# Patient Record
Sex: Male | Born: 1985 | Race: White | Hispanic: No | Marital: Single | Smoking: Never smoker
Health system: Southern US, Community
[De-identification: ages and names within clinical notes are randomized; demographics above are authoritative.]

## PROBLEM LIST (undated history)

## (undated) DIAGNOSIS — J302 Other seasonal allergic rhinitis: Secondary | ICD-10-CM

## (undated) DIAGNOSIS — F32A Depression, unspecified: Secondary | ICD-10-CM

## (undated) DIAGNOSIS — R030 Elevated blood-pressure reading, without diagnosis of hypertension: Secondary | ICD-10-CM

## (undated) DIAGNOSIS — IMO0001 Reserved for inherently not codable concepts without codable children: Secondary | ICD-10-CM

## (undated) DIAGNOSIS — F419 Anxiety disorder, unspecified: Secondary | ICD-10-CM

## (undated) DIAGNOSIS — G43909 Migraine, unspecified, not intractable, without status migrainosus: Secondary | ICD-10-CM

## (undated) DIAGNOSIS — F329 Major depressive disorder, single episode, unspecified: Secondary | ICD-10-CM

## (undated) DIAGNOSIS — R5383 Other fatigue: Secondary | ICD-10-CM

## (undated) DIAGNOSIS — K219 Gastro-esophageal reflux disease without esophagitis: Secondary | ICD-10-CM

## (undated) HISTORY — DX: Other seasonal allergic rhinitis: J30.2

## (undated) HISTORY — PX: OTHER SURGICAL HISTORY: SHX169

## (undated) HISTORY — DX: Major depressive disorder, single episode, unspecified: F32.9

## (undated) HISTORY — DX: Reserved for inherently not codable concepts without codable children: IMO0001

## (undated) HISTORY — PX: WISDOM TOOTH EXTRACTION: SHX21

## (undated) HISTORY — PX: INNER EAR SURGERY: SHX679

## (undated) HISTORY — DX: Depression, unspecified: F32.A

## (undated) HISTORY — DX: Migraine, unspecified, not intractable, without status migrainosus: G43.909

## (undated) HISTORY — DX: Gastro-esophageal reflux disease without esophagitis: K21.9

## (undated) HISTORY — DX: Elevated blood-pressure reading, without diagnosis of hypertension: R03.0

## (undated) HISTORY — DX: Other fatigue: R53.83

---
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
## 2010-07-26 ENCOUNTER — Observation Stay (HOSPITAL_COMMUNITY)
Admission: AD | Admit: 2010-07-26 | Discharge: 2010-07-28 | DRG: 581 | Disposition: A | Discharge status: Home / Self Care | Payer: 59 | Source: Ambulatory Visit | Attending: Orthopedic Surgery | Admitting: Orthopedic Surgery

## 2010-07-26 DIAGNOSIS — L02619 Cutaneous abscess of unspecified foot: Principal | ICD-10-CM | POA: Insufficient documentation

## 2010-07-26 DIAGNOSIS — S91309A Unspecified open wound, unspecified foot, initial encounter: Secondary | ICD-10-CM | POA: Insufficient documentation

## 2010-07-26 DIAGNOSIS — Z1889 Other specified retained foreign body fragments: Secondary | ICD-10-CM | POA: Insufficient documentation

## 2010-07-26 DIAGNOSIS — X58XXXA Exposure to other specified factors, initial encounter: Secondary | ICD-10-CM | POA: Insufficient documentation

## 2010-07-26 DIAGNOSIS — L03119 Cellulitis of unspecified part of limb: Secondary | ICD-10-CM | POA: Insufficient documentation

## 2010-07-26 LAB — BASIC METABOLIC PANEL
Chloride: 100 mEq/L (ref 96–112)
GFR calc Af Amer: >60 mL/min (ref >60)
GFR calc non Af Amer: >60 mL/min (ref >60)
Glucose, Bld: 85 mg/dL (ref 70–99)
Potassium: 3.6 mEq/L (ref 3.5–5.1)
Sodium: 137 mEq/L (ref 135–145)

## 2010-07-26 LAB — CBC
Hemoglobin: 15.3 g/dL (ref 13.0–17.0)
MCHC: 35.3 g/dL (ref 30.0–36.0)
WBC: 11.7 10*3/uL — ABNORMAL HIGH (ref 4.0–10.5)

## 2010-07-26 LAB — DIFFERENTIAL
Basophils Absolute: 0 10*3/uL (ref 0.0–0.1)
Basophils Relative: 0 % (ref 0–1)
Monocytes Absolute: 1.2 10*3/uL — ABNORMAL HIGH (ref 0.1–1.0)
Neutro Abs: 8.3 10*3/uL — ABNORMAL HIGH (ref 1.7–7.7)
Neutrophils Relative %: 71 % (ref 43–77)

## 2010-07-26 LAB — SURGICAL PCR SCREEN: Staphylococcus aureus: INVALID — AB

## 2010-07-27 LAB — BASIC METABOLIC PANEL
Chloride: 101 mEq/L (ref 96–112)
GFR calc Af Amer: >60 mL/min (ref >60)
Potassium: 4.5 mEq/L (ref 3.5–5.1)

## 2010-07-27 LAB — CBC
HCT: 45.4 % (ref 39.0–52.0)
Platelets: 221 10*3/uL (ref 150–400)
RDW: 12.6 % (ref 11.5–15.5)
WBC: 9.4 10*3/uL (ref 4.0–10.5)

## 2010-07-30 LAB — CULTURE, ROUTINE-ABSCESS
Culture: NO GROWTH
Gram Stain: NONE SEEN

## 2010-08-30 NOTE — Op Note (Signed)
NAME:  Connor Powell, Connor Powell NO.:  [id number]  MEDICAL RECORD NO.:  [id number]  LOCATION:  DAYL                         FACILITY:  [facility]  PHYSICIAN:  Dionne Ano. Lindey Renzulli, M.D.DATE OF BIRTH:  06/09/1985  DATE OF PROCEDURE: DATE OF DISCHARGE:                              OPERATIVE REPORT   PREOPERATIVE DIAGNOSIS:  Infected left foot puncture wound with retained foreign body and likely abscess.  POSTOPERATIVE DIAGNOSIS:  Abscess left foot with foreign body.  PROCEDURES PERFORMED: 1. Irrigation and debridement left foot deep abscess. 2. Foreign body removal left foot. 3. Stress radiography.  SURGEON:  Dionne Ano. Amanda Pea, M.D.  ASSISTANT:  None.  COMPLICATIONS:  None.  ANESTHESIA:  General.  TOURNIQUET TIME:  Less than 30 minutes.  COMPLICATIONS:  None.  ESTIMATED BLOOD LOSS:  Minimal.  INDICATIONS FOR PROCEDURE:  This patient is a 25 year old male, who presented to my office Monday at 3:00 p.m.  I diagnosed his above- mentioned diagnosis and recommend swift operative treatment.  He understands and accepts risks and benefits of surgery and desires to proceed with the above-mentioned operative intervention.  In short, he was injured Friday, was seen at Urgent Care on Saturday and it was recommended that he see [place] Monday.  I did not have any contact with the Emergency Room physicians/Urgent Care.  I also did not have any contact with the patient prior to today when I swiftly recommended operative intervention.  He and his family understand the risks and benefits and desired to proceed.  They understand the risk of chronic osteo and other issues.  OPERATIVE PROCEDURE:  Patient was seen by myself and Anesthesia and underwent smooth induction of general anesthesia, we did allow appropriate time for n.p.o. status.  I have discussed this with Dr. Okey Dupre.  Following carefully evaluating the foot and the abscess as well as erythema, he was given a general anesthetic.   Once this was complete, we then performed prep and drape, scrub and paint about the foot.  This was the left foot in question.  Once this was complete, I then localized the retained foreign body/half of a needle and I then made an incision over the entrance point.  I immediately encountered purulence.  This was cultured for aerobic and anaerobic cultures.  Following culture of aerobic and anaerobic nature, I then dissected down deeply, dissected until I could remove the foreign body.  There was a large amount of de- epithelialized skin, tissue and fatty necrosis.  There was a darkened exudative process it appeared and the soft tissues where breakdown of the outer portion of the needle had occurred.  There was some type of discoloration here noted.  I very carefully removed the needle and this was an extraction of foreign body followed by I and D of skin and subcutaneous tissue, muscle and tendon tissue.  The bone was not involved.  I then copiously placed irrigant in the wound followed by placement of iodoform gauze in the wound.  Thus, the patient underwent I and D, foreign body removal and stress radiography confirmed complete removal.  I was pleased with the findings.  Following this, I wrapped him sterilely.  Iodoform was placed followed by  gauze, Kerlix and Ace wrap.  He will be admitted for IV Cipro.  I will await cultures and monitor his progress closely, elevation, minimal weightbearing will be the rule and he will notify me should any problems occur.  We will monitor his condition very closely.  All sponge, needle and instrument counts were reported as correct.     Dionne Ano. Amanda Pea, M.D.     [facility]  D:  07/26/2010  T:  07/26/2010  Job:  [id number]  cc:   Dionne Ano. Amanda Pea, M.D. Fax: [phone]  Electronically Signed by Dominica Severin M.D. on 08/30/2010 06:30:27 AM

## 2010-09-02 ENCOUNTER — Emergency Department (HOSPITAL_COMMUNITY)
Admission: EM | Admit: 2010-09-02 | Discharge: 2010-09-02 | Disposition: A | Discharge status: Home / Self Care | Payer: 59 | Attending: Emergency Medicine | Admitting: Emergency Medicine

## 2010-09-02 ENCOUNTER — Ambulatory Visit (INDEPENDENT_AMBULATORY_CARE_PROVIDER_SITE_OTHER): Payer: 59 | Admitting: Otolaryngology

## 2010-09-02 ENCOUNTER — Encounter: Payer: Self-pay | Admitting: *Deleted

## 2010-09-02 DIAGNOSIS — H9209 Otalgia, unspecified ear: Secondary | ICD-10-CM

## 2010-09-02 DIAGNOSIS — H66019 Acute suppurative otitis media with spontaneous rupture of ear drum, unspecified ear: Secondary | ICD-10-CM

## 2010-09-02 DIAGNOSIS — H9210 Otorrhea, unspecified ear: Secondary | ICD-10-CM | POA: Insufficient documentation

## 2010-09-02 DIAGNOSIS — H6980 Other specified disorders of Eustachian tube, unspecified ear: Secondary | ICD-10-CM

## 2010-09-02 MED ORDER — AMOXICILLIN-POT CLAVULANATE 875-125 MG PO TABS: 1.0000 | ORAL_TABLET | Freq: Two times a day (BID) | ORAL | Status: AC

## 2010-09-02 MED ORDER — NEOMYCIN-POLYMYXIN-HC 3.5-10000-1 OT SUSP: 4.0000 [drp] | Freq: Three times a day (TID) | OTIC | Status: AC

## 2010-09-02 MED ORDER — AMOXICILLIN 250 MG PO CAPS
500.0000 mg | ORAL_CAPSULE | Freq: Once | ORAL | Status: AC
  Administered 2010-09-02: 500 mg via ORAL

## 2010-09-02 NOTE — ED Notes (Signed)
Report to kristy rn

## 2010-09-02 NOTE — ED Notes (Signed)
Left ear bleeding since around 11pm last hs.

## 2010-09-02 NOTE — ED Notes (Signed)
Pt c/o bleeding from left ear that started last night around 11pm; pt denies any ear pain

## 2010-09-02 NOTE — ED Provider Notes (Signed)
History     CSN: [id number] Arrival date & time: 09/02/2010  6:00 AM  Chief Complaint  Patient presents with  . Ear Drainage    bleeding from left ear   HPI Comments: Seen 0620  Patient is a 25 y.o. male presenting with ear drainage.  Ear Drainage This is a new problem. Episode onset: woke up with blood on the pillow and blood from the left ear. Episode frequency: Has had 5 previous episodes of similar findings associated with  tympatic  rupture. The problem has not changed since onset.Pertinent negatives include no chest pain, no abdominal pain, no headaches and no shortness of breath. Associated symptoms comments: No hearing loss. The symptoms are aggravated by nothing. The symptoms are relieved by nothing. He has tried nothing for the symptoms.    History reviewed. No pertinent past medical history.  Past Surgical History  Procedure Date  . Wisdom tooth extraction     History reviewed. No pertinent family history.  History  Substance Use Topics  . Smoking status: Never Smoker   . Smokeless tobacco: Not on file  . Alcohol Use: Yes     rarely      Review of Systems  HENT: Positive for ear discharge.   Respiratory: Negative for shortness of breath.   Cardiovascular: Negative for chest pain.  Gastrointestinal: Negative for abdominal pain.  Neurological: Negative for headaches.  All other systems reviewed and are negative.    Physical Exam  BP 137/78  Pulse 83  Temp(Src) 98.5 F (36.9 C) (Oral)  Resp 20  Ht 5\' 7"  (1.702 m)  Wt 235 lb (106.595 kg)  BMI 36.81 kg/m2  SpO2 99%  Physical Exam  Nursing note and vitals reviewed. Constitutional: He is oriented to person, place, and time. He appears well-developed and well-nourished.  HENT:  Head: Normocephalic and atraumatic.  Right Ear: External ear normal.       Left tympanic membrane with rupture. Mild exudate.  Eyes: EOM are normal. Pupils are equal, round, and reactive to light.  Neck: Normal range of  motion. Neck supple.  Cardiovascular: Normal rate, normal heart sounds and intact distal pulses.   Pulmonary/Chest: Effort normal and breath sounds normal.  Abdominal: Soft. Bowel sounds are normal.  Musculoskeletal: Normal range of motion.  Neurological: He is alert and oriented to person, place, and time.  Skin: Skin is warm and dry.    ED Course  Procedures  MDM       Nicoletta Dress. Colon Branch, MD 09/02/10 [phone]

## 2010-09-22 ENCOUNTER — Other Ambulatory Visit (INDEPENDENT_AMBULATORY_CARE_PROVIDER_SITE_OTHER): Payer: Self-pay | Admitting: Otolaryngology

## 2010-09-22 DIAGNOSIS — H7190 Unspecified cholesteatoma, unspecified ear: Secondary | ICD-10-CM

## 2010-09-29 ENCOUNTER — Other Ambulatory Visit: Payer: 59

## 2010-09-29 ENCOUNTER — Ambulatory Visit
Admission: RE | Admit: 2010-09-29 | Discharge: 2010-09-29 | Disposition: A | Discharge status: Home / Self Care | Payer: 59 | Source: Ambulatory Visit | Attending: Otolaryngology | Admitting: Otolaryngology

## 2010-09-29 DIAGNOSIS — H7190 Unspecified cholesteatoma, unspecified ear: Secondary | ICD-10-CM

## 2011-05-06 ENCOUNTER — Encounter (HOSPITAL_COMMUNITY): Payer: Self-pay

## 2011-05-06 ENCOUNTER — Emergency Department (HOSPITAL_COMMUNITY)
Admission: EM | Admit: 2011-05-06 | Discharge: 2011-05-07 | Disposition: A | Discharge status: Home / Self Care | Payer: 59 | Attending: Emergency Medicine | Admitting: Emergency Medicine

## 2011-05-06 ENCOUNTER — Emergency Department (HOSPITAL_COMMUNITY): Payer: 59

## 2011-05-06 DIAGNOSIS — R55 Syncope and collapse: Secondary | ICD-10-CM | POA: Insufficient documentation

## 2011-05-06 DIAGNOSIS — R51 Headache: Secondary | ICD-10-CM | POA: Insufficient documentation

## 2011-05-06 HISTORY — DX: Anxiety disorder, unspecified: F41.9

## 2011-05-06 LAB — DIFFERENTIAL
Eosinophils Relative: 2 % (ref 0–5)
Lymphocytes Relative: 26 % (ref 12–46)
Lymphs Abs: 2.1 10*3/uL (ref 0.7–4.0)
Monocytes Absolute: 0.7 10*3/uL (ref 0.1–1.0)

## 2011-05-06 LAB — CBC
HCT: 40.5 % (ref 39.0–52.0)
MCV: 82.5 fL (ref 78.0–100.0)
RBC: 4.91 MIL/uL (ref 4.22–5.81)
WBC: 8.2 10*3/uL (ref 4.0–10.5)

## 2011-05-06 LAB — COMPREHENSIVE METABOLIC PANEL
ALT: 17 U/L (ref 0–53)
CO2: 28 mEq/L (ref 19–32)
Calcium: 9.3 mg/dL (ref 8.4–10.5)
Creatinine, Ser: 1.03 mg/dL (ref 0.50–1.35)
GFR calc Af Amer: >90 mL/min (ref >90)
GFR calc non Af Amer: >90 mL/min (ref >90)
Glucose, Bld: 92 mg/dL (ref 70–99)
Sodium: 138 mEq/L (ref 135–145)
Total Bilirubin: 0.3 mg/dL (ref 0.3–1.2)

## 2011-05-06 NOTE — Discharge Instructions (Signed)
You need to get further testing done that can't be done in the ED. You should get an EEG to see if you have abnormal brain waves that could indicate your could be having seizures and you should be evaluated by a neurologist You can call Dr Ronal Fear office to get an appointment to see him . You should also get a cardiology evaluation to make sure your passing out episodes aren't related to your heart. Call Dr Marvel Plan office to get an appointment for that. NO DRIVING UNTIL YOU ARE CLEARED TO DRIVE BY DR [facility]!!!  Driving and Equipment Restrictions Some medical problems make it dangerous to drive, ride a bike, or use machines. Some of these problems are:  A hard blow to the head (concussion).   Passing out (fainting).   Twitching and shaking (seizures).   Low blood sugar.   Taking medicine to help you relax (sedatives).   Taking pain medicines.   Wearing an eye patch.   Wearing splints. This can make it hard to use parts of your body that you need to drive safely.  HOME CARE   Do not drive until your doctor says it is okay.   Do not use machines until your doctor says it is okay.  You may need a form signed by your doctor (medical release) before you can drive again. You may also need this form before you do other tasks where you need to be fully alert. MAKE SURE YOU:  Understand these instructions.   Will watch your condition.   Will get help right away if you are not doing well or get worse.  Document Released: 02/04/2004 Document Revised: 12/16/2010 Document Reviewed: 05/06/2009 [facility] Patient Information 2012 [place], [place].Syncope Syncope (fainting) is a sudden, short loss of consciousness. People normally fall to the ground when they faint. Recovery is often fast. HOME CARE  Do not drive or use machines. Wait until your doctor says it is safe to do so.   If you have diabetes, check your blood sugar. If it is low (below 70), you need to drink or eat something  sweet. If over 300, call your doctor.   If you have a blood pressure machine at home, take your blood pressure. If the top number is below 100 or above 170, call your doctor.   Lie down until you feel normal.   Drink extra fluids (water, juice, soup).  GET HELP RIGHT AWAY IF:   You pass out (faint) when sitting or lying down. Do not drive. Call your local emergency services (911 in [place] if no one is there to help you.   There is chest pain.   You feel sick to your stomach (nauseous) or keep throwing up (vomiting).   You have very bad belly (abdominal) pain.   You feel your heartbeat is fast or not normal.   You lose feeling in some part of the body.   You cannot move your arms or legs.   You cannot talk well and get confused.   You feel weak or cannot see well.   You get sweaty and feel lightheaded.  MAKE SURE YOU:   Understand these instructions.   Will watch your condition.   Will get help right away if you are not doing well or get worse.  Document Released: 06/15/2007 Document Revised: 12/16/2010 Document Reviewed: 06/15/2007 [facility] Patient Information 2012 [place], [place].

## 2011-05-06 NOTE — ED Provider Notes (Signed)
This chart was scribed for Ward Givens, MD by Williemae Natter. The patient was seen in room APAH7/APAH7 at 9:22 PM.  History     CSN: [id number]  Arrival date & time 05/06/11  1907   First MD Initiated Contact with Patient 05/06/11 2049      Chief Complaint  Patient presents with  . Loss of Consciousness    (Consider location/radiation/quality/duration/timing/severity/associated sxs/prior treatment) Patient is a 26 y.o. male presenting with syncope. The history is provided by the patient and a parent.  Loss of Consciousness This is a recurrent problem. The current episode started 3 to 5 hours ago. The problem occurs rarely. The problem has not changed since onset.Associated symptoms include headaches. Pertinent negatives include no chest pain and no shortness of breath. The symptoms are aggravated by nothing. The symptoms are relieved by nothing. He has tried nothing for the symptoms.   Connor Powell is a 26 y.o. male who presents to the Emergency Department complaining of acute onset loss of consciousness. Pt blacked out while walking through the kitchen at 4:30 PM today. Pt did not feel light-headed or have any other warnings before losing consciousness other than he had a mild headache on the left side of head. LOC at 4:30 PM and  woke up at 5:10 PM today. PT felt tired after coming to. No chest pain, no sob, no nausea, no vomiting, no light-headedness, no palpitations, no dizziness, and no excessive sweating. Similar episode of syncope yesterday out in the yard where he was found and woken up by Dad. Pt has blackouts like this a few times a year for the past 6 or 7 years but parents were unaware. No one has ever witnessed any of the events. He denies incontinance. No family hx of seizures. No change in habits or precipitating events.   Pt relates he was having trouble sleeping and took ativan 2 mg at night for sleep.    PCP-Dr. Marva Panda at [facility]  Past  Medical History  Diagnosis Date  . Depressed   . Anxiety     Past Surgical History  Procedure Date  . Wisdom tooth extraction   . Inner ear surgery   cholesteoma surgery on left in October.  No family history on file. MGF died aged 74 from MI MGM CAD FOP AODM No seizures AODM  History  Substance Use Topics  . Smoking status: Never Smoker   . Smokeless tobacco: Not on file  . Alcohol Use: Yes     rarely  lives with parents unemployed   Review of Systems  Constitutional: Negative for fever.  Respiratory: Negative for shortness of breath.   Cardiovascular: Positive for syncope. Negative for chest pain.  Neurological: Positive for syncope and headaches. Negative for seizures, speech difficulty, light-headedness and numbness.  All other systems reviewed and are negative.    Allergies  Aspirin  Home Medications   Current Outpatient Rx  Name Route Sig Dispense Refill  . CITALOPRAM HYDROBROMIDE 40 MG PO TABS Oral Take 40 mg by mouth daily.    Marland Kitchen LORAZEPAM 2 MG PO TABS Oral Take 2 mg by mouth daily as needed. For anxiety      BP 123/74  Pulse 79  Temp(Src) 98.3 F (36.8 C) (Oral)  Resp 18  Ht 5\' 7"  (1.702 m)  Wt 235 lb (106.595 kg)  BMI 36.81 kg/m2  SpO2 100%  Vital signs normal   Orthostatic VS normal   Physical Exam  Nursing note  and vitals reviewed. Constitutional: He is oriented to person, place, and time. He appears well-developed and well-nourished.  Non-toxic appearance. He does not appear ill. No distress.  HENT:  Head: Normocephalic and atraumatic.  Right Ear: External ear normal.  Left Ear: External ear normal.  Nose: Nose normal. No mucosal edema or rhinorrhea.  Mouth/Throat: Oropharynx is clear and moist and mucous membranes are normal. No dental abscesses or uvula swelling.       Mildy tender in the right parietal area where he states he hit his head today when he fell.   Eyes: Conjunctivae and EOM are normal. Pupils are equal, round, and  reactive to light.  Neck: Normal range of motion and full passive range of motion without pain. Neck supple.  Cardiovascular: Normal rate, regular rhythm and normal heart sounds.  Exam reveals no gallop and no friction rub.   No murmur heard. Pulmonary/Chest: Effort normal and breath sounds normal. No respiratory distress. He has no wheezes. He has no rhonchi. He has no rales. He exhibits no tenderness and no crepitus.  Abdominal: Soft. Normal appearance and bowel sounds are normal. He exhibits no distension. There is no tenderness. There is no rebound and no guarding.  Musculoskeletal: Normal range of motion. He exhibits no edema and no tenderness.       Moves all extremities well.   Neurological: He is alert and oriented to person, place, and time. He has normal strength. No cranial nerve deficit or sensory deficit. He exhibits normal muscle tone. Coordination normal.  Skin: Skin is warm, dry and intact. No rash noted. No erythema. No pallor.  Psychiatric: He has a normal mood and affect. His speech is normal and behavior is normal. His mood appears not anxious.    ED Course  Procedures (including critical care time) DIAGNOSTIC STUDIES: Oxygen Saturation is 100% on room air, normal by my interpretation.    COORDINATION OF CARE:  Results for orders placed during the hospital encounter of 05/06/11  CBC      Component Value Range   WBC 8.2  4.0 - 10.5 (K/uL)   RBC 4.91  4.22 - 5.81 (MIL/uL)   Hemoglobin 14.2  13.0 - 17.0 (g/dL)   HCT 16.1  09.6 - 04.5 (%)   MCV 82.5  78.0 - 100.0 (fL)   MCH 28.9  26.0 - 34.0 (pg)   MCHC 35.1  30.0 - 36.0 (g/dL)   RDW 40.9  81.1 - 91.4 (%)   Platelets 196  150 - 400 (K/uL)  DIFFERENTIAL      Component Value Range   Neutrophils Relative 63  43 - 77 (%)   Neutro Abs 5.2  1.7 - 7.7 (K/uL)   Lymphocytes Relative 26  12 - 46 (%)   Lymphs Abs 2.1  0.7 - 4.0 (K/uL)   Monocytes Relative 9  3 - 12 (%)   Monocytes Absolute 0.7  0.1 - 1.0 (K/uL)    Eosinophils Relative 2  0 - 5 (%)   Eosinophils Absolute 0.2  0.0 - 0.7 (K/uL)   Basophils Relative 0  0 - 1 (%)   Basophils Absolute 0.0  0.0 - 0.1 (K/uL)  COMPREHENSIVE METABOLIC PANEL      Component Value Range   Sodium 138  135 - 145 (mEq/L)   Potassium 3.5  3.5 - 5.1 (mEq/L)   Chloride 101  96 - 112 (mEq/L)   CO2 28  19 - 32 (mEq/L)   Glucose, Bld 92  70 - 99 (mg/dL)  BUN 10  6 - 23 (mg/dL)   Creatinine, Ser 1.61  0.50 - 1.35 (mg/dL)   Calcium 9.3  8.4 - 09.6 (mg/dL)   Total Protein 6.7  6.0 - 8.3 (g/dL)   Albumin 4.1  3.5 - 5.2 (g/dL)   AST 17  0 - 37 (U/L)   ALT 17  0 - 53 (U/L)   Alkaline Phosphatase 56  39 - 117 (U/L)   Total Bilirubin 0.3  0.3 - 1.2 (mg/dL)   GFR calc non Af Amer >90  >90 (mL/min)   GFR calc Af Amer >90  >90 (mL/min)  MAGNESIUM      Component Value Range   Magnesium 2.1  1.5 - 2.5 (mg/dL)  TROPONIN I      Component Value Range   Troponin I <0.30  <0.30 (ng/mL)   Laboratory interpretation all normal  Ct Head Wo Contrast  05/06/2011  *RADIOLOGY REPORT*  Clinical Data: Syncopal episode  CT HEAD WITHOUT CONTRAST  Technique:  Contiguous axial images were obtained from the base of the skull through the vertex without contrast.  Comparison: 09/29/2010  Findings: There is no evidence for acute hemorrhage, hydrocephalus, mass lesion, or abnormal extra-axial fluid collection.  No definite CT evidence for acute infarction.  The visualized paranasal sinuses are predominately clear. The mastoid air cells are underpneumatized and partially opacified, similar to comparison. Increased fluid/debris within the left external auditory canal.  IMPRESSION: No acute intracranial abnormality.  Underpneumatized and partially opacified mastoid air cells. 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  Interval increased fluid/debris within the left external auditory canal.  Correlate with direct inspection.  Original Report Authenticated By: Waneta Martins, M.D.     Date:  05/07/2011  Rate: 66  Rhythm: normal sinus rhythm and sinus arrhythmia  QRS Axis: normal  Intervals: normal  ST/T Wave abnormalities: normal  Conduction Disutrbances:none  Narrative Interpretation:   Old EKG Reviewed: none available   1. Syncopal episodes     Plan discharge  Devoria Albe, MD, FACEP   MDM   I personally performed the services described in this documentation, which was scribed in my presence. The recorded information has been reviewed and considered. Devoria Albe, MD, FACEP         Ward Givens, MD 05/07/11 0001

## 2011-05-06 NOTE — ED Notes (Signed)
Pt presents after "passing out" today and hitting head on floor. Per Mother pt had same occur yesterday and pt was found unconscious laying face down in yard. Pt states "this happened every once in a while but I thought it was from low blood pressure or low blood sugar". Pt reports noting a slight headache before syncopal episode.

## 2011-05-07 NOTE — ED Notes (Signed)
Discharge instructions reviewed with pt, questions answered. Pt verbalized understanding.  

## 2011-06-15 ENCOUNTER — Encounter: Payer: Self-pay | Admitting: Cardiology

## 2011-06-16 ENCOUNTER — Encounter: Payer: Self-pay | Admitting: Cardiology

## 2011-06-16 ENCOUNTER — Ambulatory Visit (INDEPENDENT_AMBULATORY_CARE_PROVIDER_SITE_OTHER): Payer: 59 | Admitting: Cardiology

## 2011-06-16 VITALS — BP 148/94 | HR 86 | Resp 18 | Ht 67.0 in | Wt 244.0 lb

## 2011-06-16 DIAGNOSIS — R55 Syncope and collapse: Secondary | ICD-10-CM

## 2011-06-16 DIAGNOSIS — R030 Elevated blood-pressure reading, without diagnosis of hypertension: Secondary | ICD-10-CM

## 2011-06-16 NOTE — Progress Notes (Signed)
   Clinical Summary Mr. Incorvaia is a 26 y.o.male referred for evaluation of syncope after ER visit back in April. He has intermittent medical care at [facility].  He reports a history of recurrent syncope, noted over at least the last five years. These episodes occur without warning, usually when he is at home and alone, have no clear precipitant or aura. He reports no palpitations or chest pain these events. Also not related to loud sounds or exercise. Typically these occur 1-2 times a year. He has had no events since April. States mental stress may be involved.  Copy of ECG from April reviewed showing sinus rhythm with no clear evidence of preexcitation, PR interval 140 ms, no Brugada pattern, no QT prolongation.  He states that he had a neurological workup with Dr. Gerilyn Pilgrim without diagnosis.  Episodes have preceded use of current medications.  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   Allergies  Allergen Reactions  . Aspirin Hives and Shortness Of Breath    Patient took Alka-Seltzer in the past-resulted in Hives, shortness of breath    Current Outpatient Prescriptions  Medication Sig Dispense Refill  . citalopram (CELEXA) 40 MG tablet Take 40 mg by mouth daily.      Marland Kitchen LORazepam (ATIVAN) 2 MG tablet Take 2 mg by mouth daily as needed. For anxiety        Past Medical History  Diagnosis Date  . Depression   . Anxiety   . Elevated blood pressure     Has not required treatment    Past Surgical History  Procedure Date  . Wisdom tooth extraction   . Inner ear surgery   . Incision and drainage of left foot     Family History  Problem Relation Age of Onset  . Diabetes Father   . Heart failure Maternal Grandmother     Social History Mr. Sollenberger reports that he has never smoked. He has never used smokeless tobacco. Mr. Duval reports that he drinks alcohol.  Review of Systems No palpitations. Stable appetite. No regular exercise. No  angina. NYHA class II dyspnea. No bleeding problems. Increased stress last six months. Otherwise negative.  Physical Examination Filed Vitals:   06/16/11 1418  BP: 148/94  Pulse: 86  Resp: 18   Overweight male in NAD. HEENT: Conjunctiva and lids normal, oropharynx clear with moist mucosa. Neck: Supple, no elevated JVP or carotid bruits, no thyromegaly. Lungs: Clear to auscultation, nonlabored breathing at rest. Cardiac: Regular rate and rhythm, no S3 or significant systolic murmur, no pericardial rub. Abdomen: Soft, nontender, bowel sounds present, no guarding or rebound. Extremities: No pitting edema, distal pulses 2+. Skin: Warm and dry. Some acne. Musculoskeletal: No kyphosis. Neuropsychiatric: Alert and oriented x3, affect grossly appropriate.   ECG SInus rhythm with normal QT, no pre-excitation, no Brugada pattern.   Problem List and Plan   Syncope Outlined above - etiology not clear. Reportedly neurology workup with Dr. Gerilyn Pilgrim was unrevealing. ECG is normal as noted. No exertional component. Not clearly related to medications - the events have occurred before their use. For now plan is to obtain echocardiogram and a 30 day event monitor. Office followup arranged.  Elevated blood pressure Noted at office visit, not on medical therapy. Keep an eye on this at general medical followup.     Jonelle Sidle, M.D., F.A.C.C.

## 2011-06-16 NOTE — Assessment & Plan Note (Signed)
Outlined above - etiology not clear. Reportedly neurology workup with Dr. Gerilyn Pilgrim was unrevealing. ECG is normal as noted. No exertional component. Not clearly related to medications - the events have occurred before their use. For now plan is to obtain echocardiogram and a 30 day event monitor. Office followup arranged.

## 2011-06-16 NOTE — Assessment & Plan Note (Signed)
Noted at office visit, not on medical therapy. Keep an eye on this at general medical followup.

## 2011-06-16 NOTE — Patient Instructions (Signed)
**Note De-Identified Connor Powell Obfuscation** Your physician has recommended that you wear an event monitor. Event monitors are medical devices that record the heart's electrical activity. Doctors most often [place] these monitors to diagnose arrhythmias. Arrhythmias are problems with the speed or rhythm of the heartbeat. The monitor is a small, portable device. You can wear one while you do your normal daily activities. This is usually used to diagnose what is causing palpitations/syncope (passing out).  Your physician has requested that you have an echocardiogram. Echocardiography is a painless test that uses sound waves to create images of your heart. It provides your doctor with information about the size and shape of your heart and how well your heart's chambers and valves are working. This procedure takes approximately one hour. There are no restrictions for this procedure.  Your physician recommends that you schedule a follow-up appointment in: 4 to 6 weeks

## 2011-07-13 ENCOUNTER — Ambulatory Visit: Payer: 59 | Admitting: Cardiology

## 2011-07-13 ENCOUNTER — Telehealth: Payer: Self-pay

## 2011-07-13 NOTE — Telephone Encounter (Signed)
Pt. scheduled to see Dr. Diona Browner today at 1:40 but he has not had Echo that was ordered at last OV and did not wear Event monitor as directed. He states that he had an allergic reaction to the electrodes so he stopped wearing event monitor, he did not notify this office or [employer]. Representative at [employer] that he will mail electrodes for sensitive skin to pt's address. Pt. Advised and he states he will wear monitor for 30 days and that if he has another allergic reaction he will contact this office./LV

## 2011-07-26 ENCOUNTER — Ambulatory Visit (HOSPITAL_COMMUNITY)
Admission: RE | Admit: 2011-07-26 | Discharge: 2011-07-26 | Disposition: A | Discharge status: Home / Self Care | Payer: 59 | Source: Ambulatory Visit | Attending: Cardiology | Admitting: Cardiology

## 2011-07-26 ENCOUNTER — Telehealth: Payer: Self-pay

## 2011-07-26 DIAGNOSIS — R55 Syncope and collapse: Secondary | ICD-10-CM | POA: Insufficient documentation

## 2011-07-26 DIAGNOSIS — I10 Essential (primary) hypertension: Secondary | ICD-10-CM | POA: Insufficient documentation

## 2011-07-26 NOTE — Progress Notes (Signed)
*  PRELIMINARY RESULTS* Echocardiogram 2D Echocardiogram has been performed.  Caswell Corwin 07/26/2011, 9:31 AM

## 2011-07-26 NOTE — Telephone Encounter (Signed)
Pt. Walked into office this morning to return Event monitor and to inform us that he is moving to [place] and will not f/u with [place] as he is moving soon. Pt. did not wear monitor as advised b/c he had allergic reaction to electrodes and, even though [employer] sent him electrodes for sensitive skin, did not want to try again. Pt. Request that we mail results of  Echo to address listed in his chart./LV

## 2011-07-28 ENCOUNTER — Other Ambulatory Visit: Payer: Self-pay | Admitting: Cardiology

## 2011-07-28 DIAGNOSIS — R55 Syncope and collapse: Secondary | ICD-10-CM

## 2011-07-29 ENCOUNTER — Encounter: Payer: Self-pay | Admitting: *Deleted

## 2011-08-09 ENCOUNTER — Telehealth: Payer: Self-pay | Admitting: Cardiology

## 2011-08-09 NOTE — Telephone Encounter (Signed)
Patient notified that I will contact [employer] to advise them this monitor was sent back some time ago.  Spoke with Dawn at [employer], who states that patient will not receive any more calls.

## 2011-08-09 NOTE — Telephone Encounter (Signed)
States that event monitor was returned here to the office to be sent back. States that they have gotten several calls from [employer] since then stating that they have not received the monitor. / tg

## 2011-08-17 ENCOUNTER — Ambulatory Visit: Payer: 59 | Admitting: Cardiology

## 2011-11-17 ENCOUNTER — Ambulatory Visit (INDEPENDENT_AMBULATORY_CARE_PROVIDER_SITE_OTHER): Payer: 59 | Admitting: Otolaryngology

## 2011-11-17 DIAGNOSIS — H95129 Granulation of postmastoidectomy cavity, unspecified ear: Secondary | ICD-10-CM

## 2012-05-17 ENCOUNTER — Ambulatory Visit (INDEPENDENT_AMBULATORY_CARE_PROVIDER_SITE_OTHER): Payer: 59 | Admitting: Otolaryngology

## 2012-05-17 DIAGNOSIS — H95129 Granulation of postmastoidectomy cavity, unspecified ear: Secondary | ICD-10-CM

## 2012-11-15 ENCOUNTER — Ambulatory Visit (INDEPENDENT_AMBULATORY_CARE_PROVIDER_SITE_OTHER): Payer: 59 | Admitting: Otolaryngology

## 2013-01-31 IMAGING — CT CT HEAD W/O CM
1 of 2 series · 16 of 30 positions shown, 20 images · non-contrast
Comparison: 09/29/2010

CLINICAL DATA: Syncopal episode

CT HEAD WITHOUT CONTRAST
TECHNIQUE: Contiguous axial images were obtained from the base of
the skull through the vertex without contrast.

[Series 3: headtrauma 2.4 h60s · axial · 0.48mm/px · z∈[+162,+325]mm · 16 of 72 slices shown, 20 images]
[im 4/72  brain]
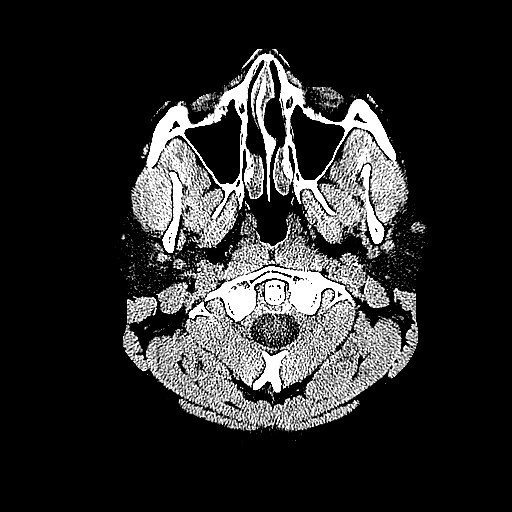
[im 4/72  bone]
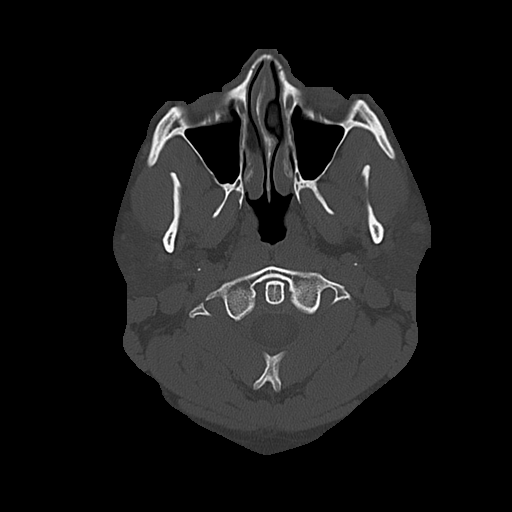
[im 8/72  brain]
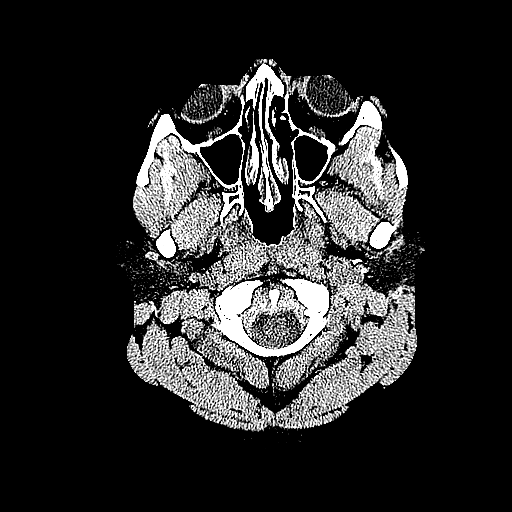
[im 12/72  brain]
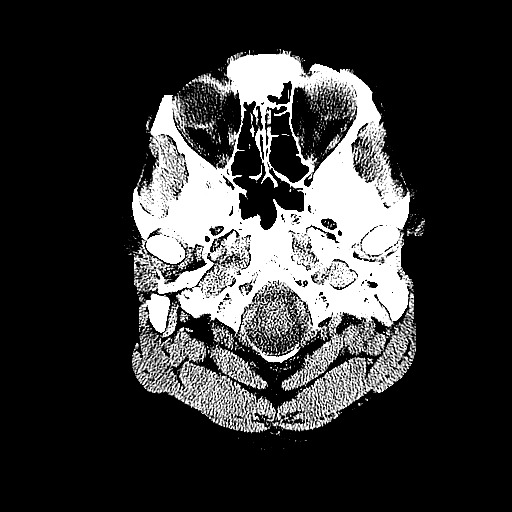
[im 15/72  brain]
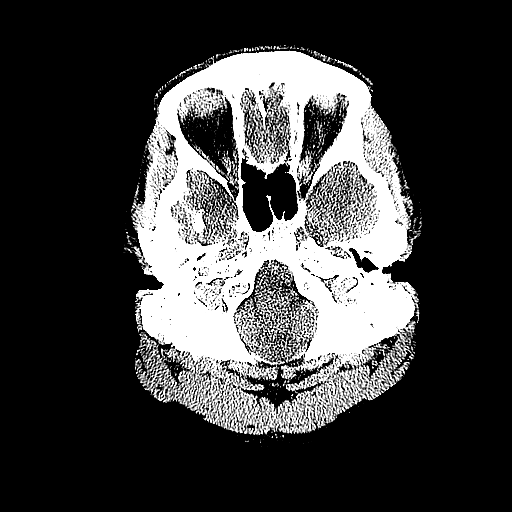
[im 23/72  brain]
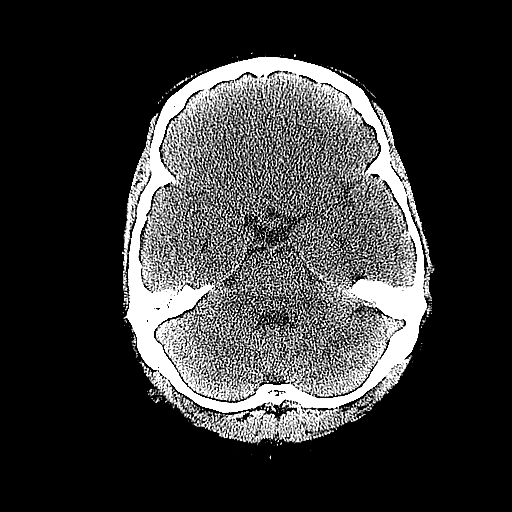
[im 23/72  bone]
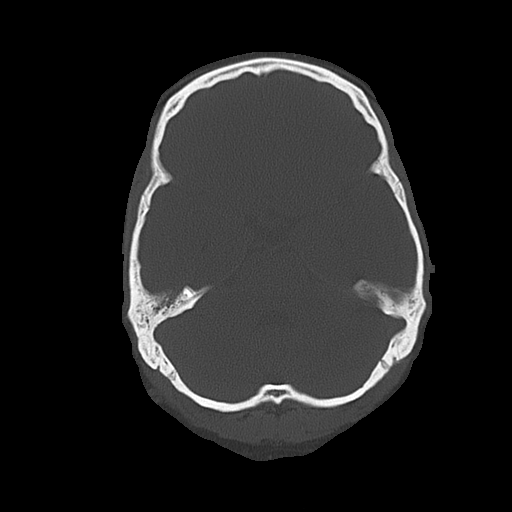
[im 27/72  brain]
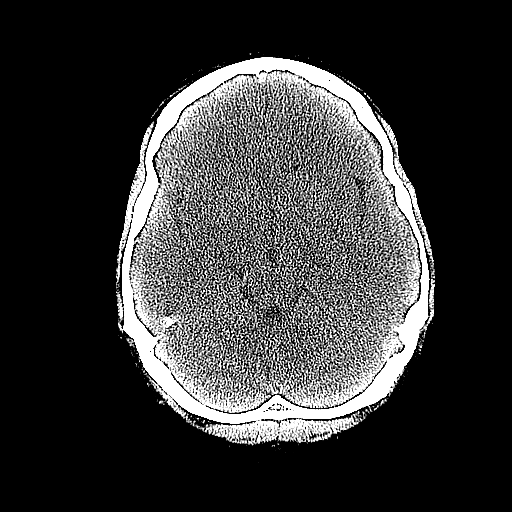
[im 30/72  brain]
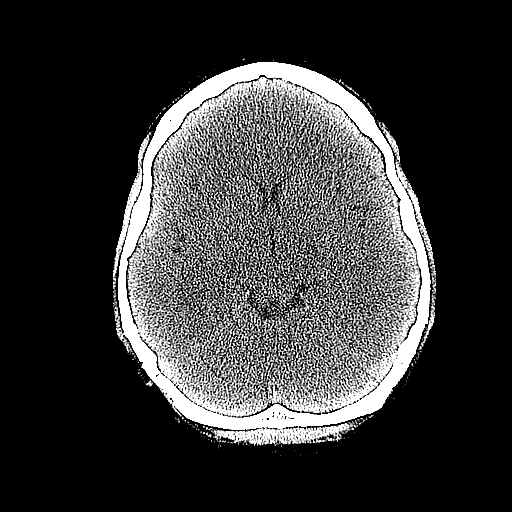
[im 34/72  brain]
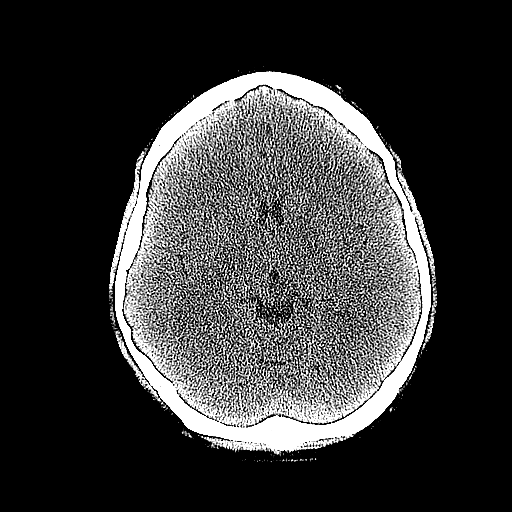
[im 38/72  brain]
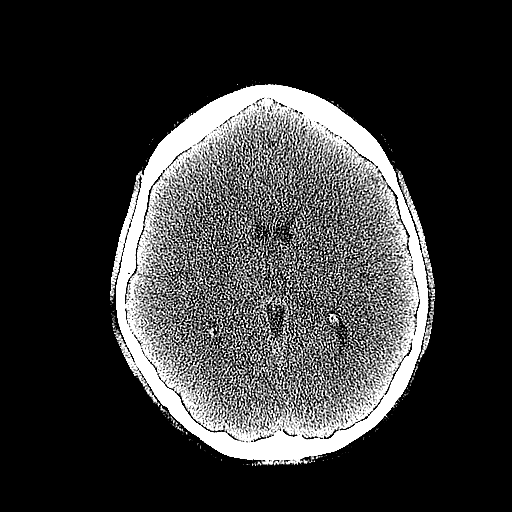
[im 38/72  bone]
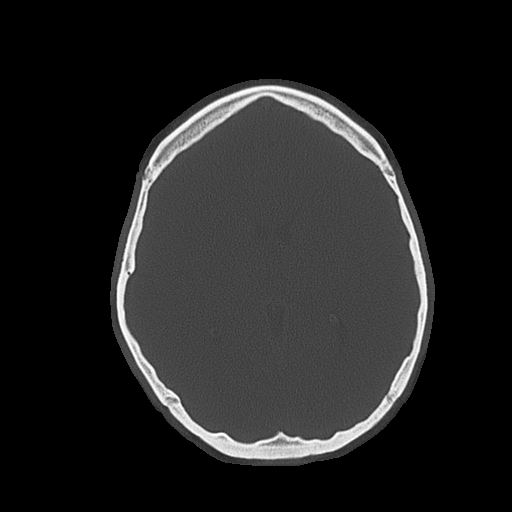
[im 42/72  brain]
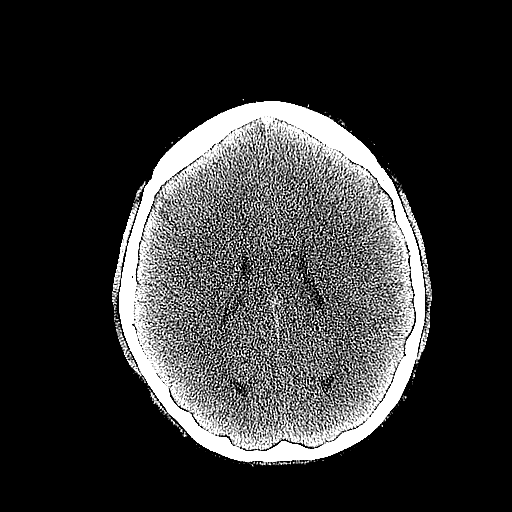
[im 45/72  brain]
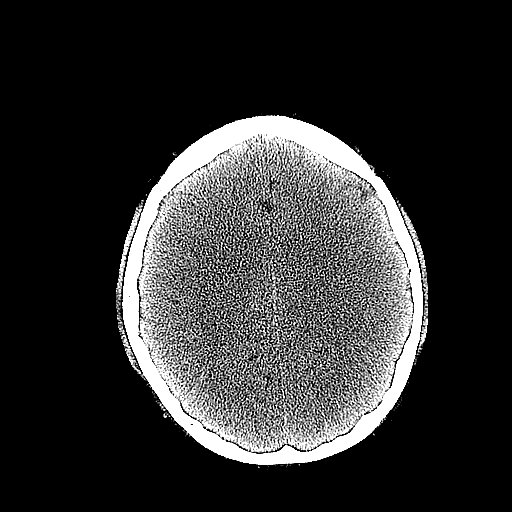
[im 49/72  brain]
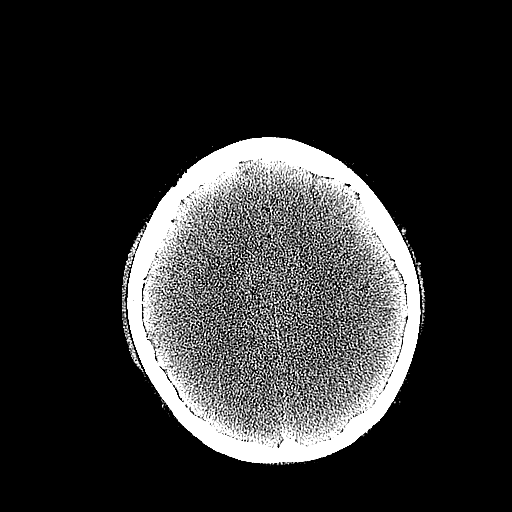
[im 57/72  brain]
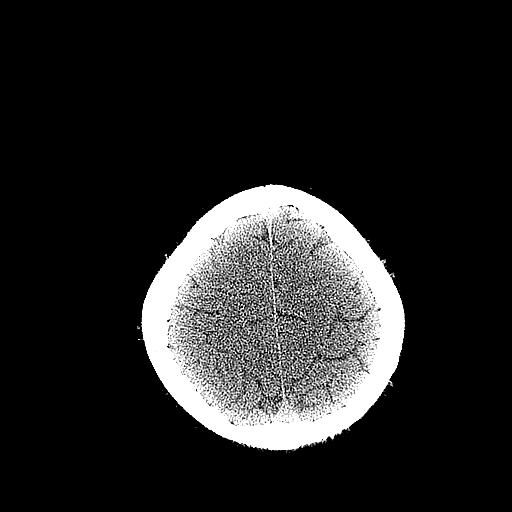
[im 57/72  bone]
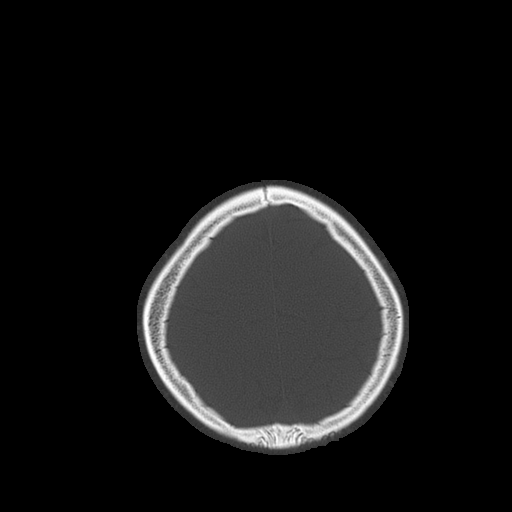
[im 60/72  brain]
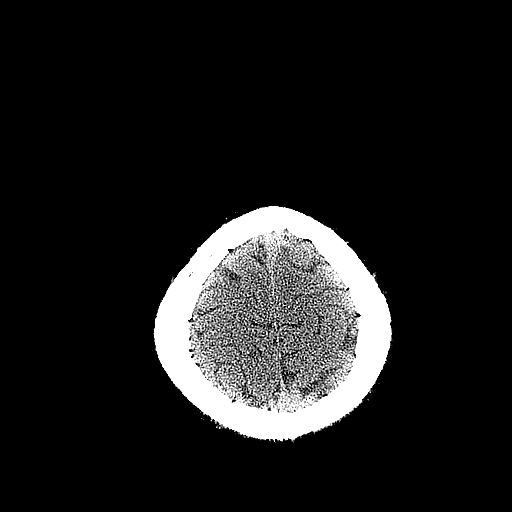
[im 64/72  brain]
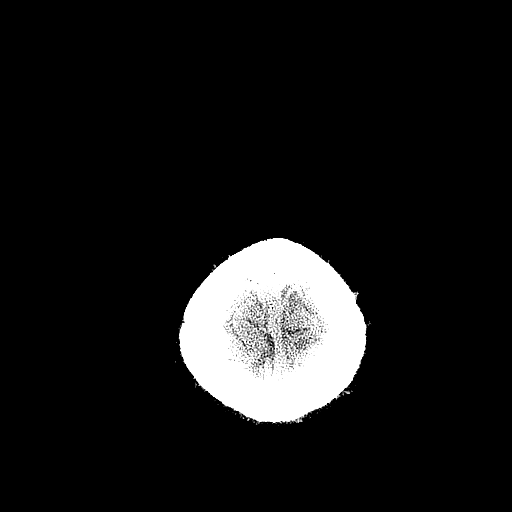
[im 68/72  brain]
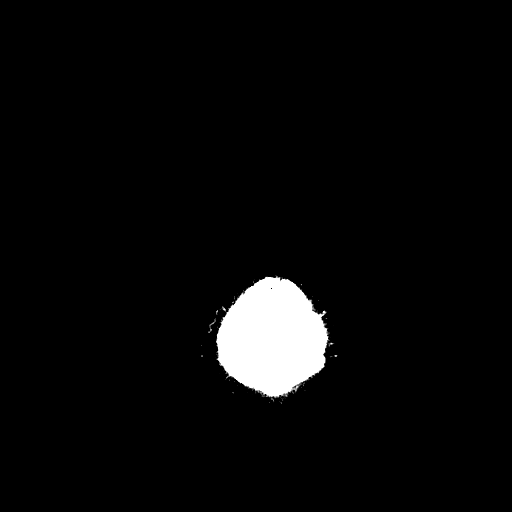

[16 of 30 positions shown; findings below may reference images not displayed]

FINDINGS: There is no evidence for acute hemorrhage, hydrocephalus,
mass lesion, or abnormal extra-axial fluid collection.  No definite
CT evidence for acute infarction.  The visualized paranasal sinuses
are predominately clear. The mastoid air cells are underpneumatized
and partially opacified, similar to comparison. Increased
fluid/debris within the left external auditory canal.
IMPRESSION: No acute intracranial abnormality.

Underpneumatized and partially opacified mastoid air cells.
Cholesteatoma previously diagnosed on the left.  Interval increased
fluid/debris within the left external auditory canal.  Correlate
with direct inspection.

## 2013-06-06 ENCOUNTER — Ambulatory Visit (INDEPENDENT_AMBULATORY_CARE_PROVIDER_SITE_OTHER): Payer: 59 | Admitting: Neurology

## 2013-06-06 ENCOUNTER — Encounter: Payer: Self-pay | Admitting: Neurology

## 2013-06-06 ENCOUNTER — Encounter (INDEPENDENT_AMBULATORY_CARE_PROVIDER_SITE_OTHER): Payer: Self-pay

## 2013-06-06 VITALS — BP 133/87 | HR 85 | Ht 66.5 in | Wt 258.0 lb

## 2013-06-06 DIAGNOSIS — IMO0001 Reserved for inherently not codable concepts without codable children: Secondary | ICD-10-CM

## 2013-06-06 DIAGNOSIS — R55 Syncope and collapse: Secondary | ICD-10-CM

## 2013-06-06 DIAGNOSIS — R51 Headache: Secondary | ICD-10-CM

## 2013-06-06 DIAGNOSIS — R6889 Other general symptoms and signs: Secondary | ICD-10-CM

## 2013-06-06 DIAGNOSIS — R569 Unspecified convulsions: Secondary | ICD-10-CM

## 2013-06-06 MED ORDER — DIVALPROEX SODIUM ER 500 MG PO TB24: 500.0000 mg | ORAL_TABLET | Freq: Every day | ORAL | Status: DC

## 2013-06-06 NOTE — Patient Instructions (Signed)
I had a long discussion with the patient with regards to his multiple episodes of blackouts and near blackouts and discussed my differential diagnosis and plan for evaluation and answered questions. Check MRI of the brain with MRA of the brain and neck as well as EEG. Review records of prior neurological visit and workup with Dr. Gerilyn Pilgrim. I advised patient not to drive. Start Depakote ER 500 mg daily for headache and seizure prophylaxis. Return for follow up in 2 months or call if necessary.

## 2013-06-07 DIAGNOSIS — IMO0001 Reserved for inherently not codable concepts without codable children: Secondary | ICD-10-CM | POA: Insufficient documentation

## 2013-06-07 DIAGNOSIS — R6889 Other general symptoms and signs: Secondary | ICD-10-CM
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
## 2013-06-10 ENCOUNTER — Telehealth: Payer: Self-pay | Admitting: Neurology

## 2013-06-11 NOTE — Telephone Encounter (Signed)
Information given to Dr. Pearlean Brownie to call for peer to peer.

## 2013-06-13 ENCOUNTER — Other Ambulatory Visit (INDEPENDENT_AMBULATORY_CARE_PROVIDER_SITE_OTHER): Payer: 59 | Admitting: Radiology

## 2013-06-13 DIAGNOSIS — R55 Syncope and collapse: Secondary | ICD-10-CM

## 2013-06-13 DIAGNOSIS — R51 Headache: Secondary | ICD-10-CM

## 2013-06-27 ENCOUNTER — Ambulatory Visit (INDEPENDENT_AMBULATORY_CARE_PROVIDER_SITE_OTHER): Payer: 59

## 2013-06-27 DIAGNOSIS — R51 Headache: Secondary | ICD-10-CM

## 2013-06-27 DIAGNOSIS — R55 Syncope and collapse: Secondary | ICD-10-CM

## 2013-06-27 MED ORDER — GADOPENTETATE DIMEGLUMINE 469.01 MG/ML IV SOLN: 20.0000 mL | Freq: Once | INTRAVENOUS | Status: AC | PRN

## 2013-07-05 ENCOUNTER — Encounter: Payer: Self-pay | Admitting: Neurology

## 2013-07-08 ENCOUNTER — Telehealth: Payer: Self-pay | Admitting: *Deleted

## 2013-07-09 ENCOUNTER — Encounter: Payer: Self-pay | Admitting: Neurology

## 2013-07-09 NOTE — Telephone Encounter (Signed)
Called and shared normal MR Head, Brain ,left VM message of results and a return call concerning the paperwork patient is referring about

## 2013-07-09 NOTE — Telephone Encounter (Signed)
Spoke with patient and shared normal results of EEG,MRI per [place], he verbalized understanding and is now requesting a form that was sent in a week ago from his [employer] insurance company that needs to be completed. Explained to patient that it usually takes about 2 weeks to complete and that there may be a fee for the form, he verbalized understanding

## 2013-07-11 ENCOUNTER — Encounter: Payer: Self-pay | Admitting: Neurology

## 2013-07-15 ENCOUNTER — Telehealth: Payer: Self-pay | Admitting: Neurology

## 2013-07-15 NOTE — Telephone Encounter (Signed)
Patient's mother returning call regarding patient.

## 2013-07-15 NOTE — Telephone Encounter (Signed)
I called and left long message about appt with Dr. Pearlean BrownieSethi. 07-19-13 at 1100.  Mother or pt to call back to confirm.

## 2013-07-16 NOTE — Telephone Encounter (Signed)
Patient's mother calling to confirm appointment with Dr. Pearlean BrownieSethi on 07/19/13 @ 11:00.  If any questions please call mom at work # [phone] after 11:00. thanks

## 2013-07-19 ENCOUNTER — Encounter: Payer: Self-pay | Admitting: Neurology

## 2013-07-19 ENCOUNTER — Ambulatory Visit (INDEPENDENT_AMBULATORY_CARE_PROVIDER_SITE_OTHER): Payer: 59 | Admitting: Neurology

## 2013-07-19 VITALS — BP 123/81 | HR 88 | Temp 98.0°F | Ht 66.5 in | Wt 262.0 lb

## 2013-07-19 DIAGNOSIS — R55 Syncope and collapse: Secondary | ICD-10-CM
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
## 2013-07-19 NOTE — Patient Instructions (Signed)
I had a long discussion with the patient with regards to the results of his MRI scan, MRA scan EEG and answered questions. Continue Depakote ER 500 mg once a day for at least a 6 month trial to see if it is beneficial. Referred to cardiology for loop recorder placement to evaluate his long-standing history of blackout episodes. Return for followup in 3 months with  Heide GuileLynn Lam, NP or call earlier if necessary

## 2013-07-29 ENCOUNTER — Encounter: Payer: Self-pay | Admitting: *Deleted

## 2013-07-31 ENCOUNTER — Encounter: Payer: Self-pay | Admitting: Internal Medicine

## 2013-07-31 ENCOUNTER — Ambulatory Visit (INDEPENDENT_AMBULATORY_CARE_PROVIDER_SITE_OTHER): Payer: 59 | Admitting: Internal Medicine

## 2013-07-31 VITALS — BP 149/88 | HR 116 | Ht 67.0 in | Wt 260.0 lb

## 2013-07-31 DIAGNOSIS — R55 Syncope and collapse: Secondary | ICD-10-CM

## 2013-07-31 LAB — CBC WITH DIFFERENTIAL/PLATELET
Basophils Absolute: 0 10*3/uL (ref 0.0–0.1)
Basophils Relative: 0.3 % (ref 0.0–3.0)
EOS ABS: 0.2 10*3/uL (ref 0.0–0.7)
Eosinophils Relative: 2.5 % (ref 0.0–5.0)
HEMATOCRIT: 47.7 % (ref 39.0–52.0)
Hemoglobin: 16.3 g/dL (ref 13.0–17.0)
Lymphocytes Relative: 21.3 % (ref 12.0–46.0)
Lymphs Abs: 1.8 10*3/uL (ref 0.7–4.0)
MCHC: 34.2 g/dL (ref 30.0–36.0)
MCV: 87.4 fl (ref 78.0–100.0)
MONO ABS: 0.8 10*3/uL (ref 0.1–1.0)
Monocytes Relative: 9.6 % (ref 3.0–12.0)
Neutro Abs: 5.5 10*3/uL (ref 1.4–7.7)
Neutrophils Relative %: 66.3 % (ref 43.0–77.0)
PLATELETS: 259 10*3/uL (ref 150.0–400.0)
RBC: 5.46 Mil/uL (ref 4.22–5.81)
RDW: 12.7 % (ref 11.5–15.5)
WBC: 8.4 10*3/uL (ref 4.0–10.5)

## 2013-07-31 LAB — HEMOGLOBIN A1C: Hgb A1c MFr Bld: 5.5 % (ref 4.6–6.5)

## 2013-07-31 LAB — BASIC METABOLIC PANEL
BUN: 14 mg/dL (ref 6–23)
CALCIUM: 9.4 mg/dL (ref 8.4–10.5)
CO2: 29 mEq/L (ref 19–32)
Chloride: 100 mEq/L (ref 96–112)
Creatinine, Ser: 1 mg/dL (ref 0.4–1.5)
GFR: 91.77 mL/min (ref >60.00)
Glucose, Bld: 100 mg/dL — ABNORMAL HIGH (ref 70–99)
Potassium: 3.9 mEq/L (ref 3.5–5.1)
SODIUM: 137 meq/L (ref 135–145)

## 2013-07-31 NOTE — Progress Notes (Signed)
ELECTROPHYSIOLOGY CONSULT NOTE  Patient ID: Connor Powell, MRN: [id number], DOB/AGE: [birth year] 27 y.o. Admit date: (Not on file) Date of Consult: 07/31/2013  Primary Physician: PROVIDER NOT IN SYSTEM Primary Cardiologist: new  Chief Complaint: syncope   HPI Connor Powell is a 28 y.o. male  Referred for re\re current episodes of syncope that dates back about 10 years. He has 5e-10 episodes a year and they can come on relatively abruptly. Sometimes he has a prodrome and can sit down and potentially abort the spells. He is noted on one occasion that episode lasted about 15 minutes. When he awakens in the ground there is some disorientation and residual orthostatic intolerance followed by profound fatigue. These episodes are accompanied in the recovery phase by nausea diaphoresis and flushing. He is described as being extremely pale. He has not noted any specific triggers.  He has been thought to have seizures although his neurologic evaluation has been negative.  He acknowledges depression and PTSD.  He is on therapy.  He also has a history of a cholesteatoma surgery which left him with residual balance issues.  He also has episodes where he gets twitching either locally or more globally. These occur while he is awake.  An echocardiogram done as part of evaluation 2013 demonstrated left ventricular dilatation (55) mild left atrial enlargement and mild left ventricular dysfunction. He is noted also to have had elevated blood pressures.  He lives at home.  His diet is salt deplete; he is rather fluid replete. He has some orthostatic intolerance as well as heat intolerance. He does not note shower intolerance       Past Medical History  Diagnosis Date  . Depression   . Anxiety   . Elevated blood pressure     Has not required treatment      Surgical History:  Past Surgical History  Procedure Laterality Date  . Wisdom tooth extraction    . Inner ear surgery    .  Incision and drainage of left foot       Home Meds: Prior to Admission medications   Medication Sig Start Date End Date Taking? Authorizing Provider  citalopram (CELEXA) 40 MG tablet Take 40 mg by mouth daily.   Yes Historical Provider, MD  divalproex (DEPAKOTE ER) 500 MG 24 hr tablet Take 1 tablet (500 mg total) by mouth daily. 06/06/13  Yes Delia Heady, MD  DULoxetine (CYMBALTA) 60 MG capsule  07/02/13  Yes Historical Provider, MD  LORazepam (ATIVAN) 2 MG tablet Take 2 mg by mouth daily as needed. For anxiety   Yes Historical Provider, MD      Allergies:  Allergies  Allergen Reactions  . Aspirin Hives and Shortness Of Breath    Patient took Alka-Seltzer in the past-resulted in Hives, shortness of breath    History   Social History  . Marital Status: Single    Spouse Name: N/A    Number of Children: 0  . Years of Education: college   Occupational History  . n/a    Social History Main Topics  . Smoking status: Never Smoker   . Smokeless tobacco: Never Used  . Alcohol Use: No     Comment: Rarely  . Drug Use: No  . Sexual Activity: Not on file   Other Topics Concern  . Not on file   Social History Narrative   Patient lives with his parents, drinks sodas daily.     Family History  Problem Relation Age of Onset  .  Diabetes Father   . Heart failure Maternal Grandmother      ROS:  Please see the history of present illness.     All other systems reviewed and negative.    Physical Exam:   Blood pressure 149/88, pulse 116, height 5\' 7"  (1.702 m), weight 260 lb (117.935 kg). General: Well developed, well nourished male in no acute distress. Head: Normocephalic, atraumatic, sclera non-icteric, no xanthomas, nares are without discharge. EENT: normal Lymph Nodes:  none Back: without scoliosis/kyphosis , no CVA tendersness Neck: Negative for carotid bruits. JVD not elevated. Lungs: Clear bilaterally to auscultation without wheezes, rales, or rhonchi. Breathing is  unlabored. Heart: RRR with S1 S2. No murmur , rubs, or gallops appreciated. Abdomen: Soft, non-tender, non-distended with normoactive bowel sounds. No hepatomegaly. No rebound/guarding. No obvious abdominal masses. Msk:  Strength and tone appear normal for age. Extremities: No clubbing or cyanosis. No  edema.  Distal pedal pulses are 2+ and equal bilaterally. Skin: Warm and  diaphoretic  Neuro: Alert and oriented X 3. CN III-XII intact Grossly normal sensory and motor function . Psych:  Responds to questions appropriately with a normal affect.      Labs: Cardiac Enzymes No results found for this basename: CKTOTAL, CKMB, TROPONINI,  in the last 72 hours CBC Lab Results  Component Value Date   WBC 8.2 05/06/2011   HGB 14.2 05/06/2011   HCT 40.5 05/06/2011   MCV 82.5 05/06/2011   PLT 196 05/06/2011   PROTIME: No results found for this basename: LABPROT, INR,  in the last 72 hours Chemistry No results found for this basename: NA, K, CL, CO2, BUN, CREATININE, CALCIUM, LABALBU, PROT, BILITOT, ALKPHOS, ALT, AST, GLUCOSE,  in the last 168 hours Lipids No results found for this basename: CHOL, HDL, LDLCALC, TRIG   BNP No results found for this basename: probnp   Miscellaneous No results found for this basename: DDIMER    Radiology/Studies:  No results found.  EKG: sinus rhythm with a rate of 90   Assessment and Plan:  Syncope  Dizziness falling cholesteatoma surgery  Sleep-disordered breathing  Obesity  Cardiomyopathy-EF 50%  Hypertension  The patient has recurrent syncope which has prodrome and recovery symptoms and associated orthostatic intolerance which suggested neurally mediated syndrome. It occurs in the context of chronic depression which may aggravate it. He also has issues related to his cholesteatoma surgery which complicates interpretation.  I would recommend tilt table testing for the confirmation of the diagnosis of neurally mediated syncope. I'm not sure that  a loop recorder this point will help us.  I would recommend we undertaking a sleep study.  He has a history of a mild cardiomyopathy identified by echo 2 years ago. We will repeat his ultrasound.  He also was diaphoretic and hypertensive. We will check a thyroid, hemoglobin A1c a CBC as well as a metabolic profile. We'll also do a 24-hour urine collection for metanephrines     Sherryl MangesSteven Ladawna Walgren

## 2013-07-31 NOTE — Patient Instructions (Addendum)
Your physician recommends that you continue on your current medications as directed. Please refer to the Current Medication list given to you today.  Labs today: BMET, CBCD, 24 hour Metanephrines, A1C  Your physician has requested that you have an echocardiogram within a week. Echocardiography is a painless test that uses sound waves to create images of your heart. It provides your doctor with information about the size and shape of your heart and how well your heart's chambers and valves are working. This procedure takes approximately one hour. There are no restrictions for this procedure.  Your physician recommends that you schedule a follow-up appointment in: 12-16 weeks with Dr. Graciela HusbandsKlein.

## 2013-08-02 NOTE — Progress Notes (Signed)
I agree with the above plan 

## 2013-08-05 ENCOUNTER — Ambulatory Visit (HOSPITAL_COMMUNITY)
Admission: RE | Admit: 2013-08-05 | Discharge: 2013-08-05 | Disposition: A | Discharge status: Home / Self Care | Payer: 59 | Source: Ambulatory Visit | Attending: Cardiology | Admitting: Cardiology

## 2013-08-05 DIAGNOSIS — R55 Syncope and collapse: Secondary | ICD-10-CM | POA: Diagnosis not present

## 2013-08-05 DIAGNOSIS — I517 Cardiomegaly: Secondary | ICD-10-CM

## 2013-08-05 NOTE — Progress Notes (Signed)
2D Echocardiogram Complete.  08/05/2013   Jyasia Markoff, RDCS  

## 2013-09-12 ENCOUNTER — Telehealth: Payer: Self-pay | Admitting: Internal Medicine

## 2013-09-12 ENCOUNTER — Ambulatory Visit (INDEPENDENT_AMBULATORY_CARE_PROVIDER_SITE_OTHER): Payer: 59 | Admitting: Nurse Practitioner

## 2013-09-12 ENCOUNTER — Encounter: Payer: Self-pay | Admitting: Nurse Practitioner

## 2013-09-12 ENCOUNTER — Ambulatory Visit: Payer: 59 | Admitting: Neurology

## 2013-09-12 VITALS — BP 123/81 | HR 76 | Ht 67.0 in | Wt 259.0 lb

## 2013-09-12 DIAGNOSIS — R51 Headache: Secondary | ICD-10-CM

## 2013-09-12 DIAGNOSIS — R55 Syncope and collapse: Secondary | ICD-10-CM
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
## 2013-09-12 NOTE — Telephone Encounter (Signed)
New message           Pt mother is now ready to make appt for a tilt table test / His neurologist says he need this before they can proceed

## 2013-09-12 NOTE — Patient Instructions (Signed)
Await tilt-table testing before trying any more medications. Return for followup in 3 months with Dr. Pearlean Brownie or call earlier if necessary.

## 2013-09-15 NOTE — Progress Notes (Signed)
I agree with the above plan 

## 2013-09-30 NOTE — Telephone Encounter (Signed)
follow up:  Pt called to follow up on a Tilt Table test please give him a call back.

## 2013-10-02 NOTE — Telephone Encounter (Signed)
Discussed scheduling tilt table for 10/7. We agreed to talk again Friday to review and finalize arrangements.

## 2013-10-04 ENCOUNTER — Encounter: Payer: Self-pay | Admitting: *Deleted

## 2013-10-04 NOTE — Telephone Encounter (Signed)
Tilt table scheduled for 10/7. Patient to be at hospital at 11:00 am for 1 pm testing.  lmtcb to review instructions/time.

## 2013-10-07 ENCOUNTER — Encounter: Payer: Self-pay | Admitting: *Deleted

## 2013-10-07 NOTE — Telephone Encounter (Signed)
Reviewed instructions with patient and  letter mailed to, verified, home address per request. Patient verbalized understanding and agreeable to plan.

## 2013-10-11 ENCOUNTER — Encounter (HOSPITAL_COMMUNITY): Payer: Self-pay | Admitting: Pharmacy Technician

## 2013-10-16 ENCOUNTER — Ambulatory Visit (HOSPITAL_COMMUNITY)
Admission: RE | Admit: 2013-10-16 | Discharge: 2013-10-16 | Disposition: A | Discharge status: Home / Self Care | Payer: 59 | Source: Ambulatory Visit | Attending: Internal Medicine | Admitting: Internal Medicine

## 2013-10-16 ENCOUNTER — Encounter (HOSPITAL_COMMUNITY)
Admission: RE | Disposition: A | Discharge status: Home / Self Care | Payer: Self-pay | Source: Ambulatory Visit | Attending: Internal Medicine

## 2013-10-16 DIAGNOSIS — R030 Elevated blood-pressure reading, without diagnosis of hypertension: Secondary | ICD-10-CM | POA: Insufficient documentation

## 2013-10-16 DIAGNOSIS — R55 Syncope and collapse: Secondary | ICD-10-CM | POA: Insufficient documentation

## 2013-10-16 DIAGNOSIS — K219 Gastro-esophageal reflux disease without esophagitis: Secondary | ICD-10-CM | POA: Diagnosis not present

## 2013-10-16 HISTORY — PX: TILT TABLE STUDY: SHX5493

## 2013-10-16 SURGERY — TILT TABLE STUDY

## 2013-10-16 MED ORDER — NITROGLYCERIN 0.4 MG/SPRAY TL SOLN
Status: AC
  Filled 2013-10-16: qty 4.9

## 2013-10-16 MED ORDER — SODIUM CHLORIDE 0.9 % IV SOLN
INTRAVENOUS | Status: DC
  Administered 2013-10-16: 11:00:00 via INTRAVENOUS

## 2013-10-16 NOTE — CV Procedure (Signed)
Connor MaoWilliam G Halvorsen [id number]  [id number]  Preop GE:XBMWUXLx:syncope  Postop Dx same/   Procedure:tilt table taest  Equilibrated int he supine position fith BP aobut 120 and HR 95 Tilted upright for 15 mni and then given  NTG 0.4 mg SL  No significnat change in HR and BP  Cx: None  EBL: Minimal      Impresssion   Neg tilt table test  Sherryl MangesSteven Klein, MD 10/16/2013 2:04 PM

## 2013-10-16 NOTE — Discharge Instructions (Signed)
Tilt Table Test  A tilt table test is a test to evaluate the cause of unexplained fainting (syncope) or dizziness. You will be safely secured to a table that moves you from a lying position to an upright position. During the test, your heart rate, heart rhythm, and blood pressure will be monitored. Your caregiver may order a tilt table test for the following reasons:   Recurrent, unexplained loss of consciousness.   Recurrent near loss of consciousness.   Recurrent, unexplained dizziness.   Recurrent falls, especially in elderly people.   Sudden drop in blood pressure when standing up (orthostatic hypotension).  Causes of unexplained fainting or near fainting can involve an imbalance between the nervous and vascular systems. Fainting can occur from a drop in blood pressure or from a drop in heart rate that causes a drop in blood pressure.   RISKS AND COMPLICATIONS  During the test, you may:   Feel lightheaded or dizzy.   Faint.   Feel nauseous or vomit.   Have blurry vision.   Feel cold or clammy.  BEFORE THE PROCEDURE   Do not eat before the test. Your caregiver will tell you how many hours before the test you need to stop eating.   Ask your caregiver about changing or stopping your regular medicines. This is especially important if you are taking diabetes or blood pressure medicine.   Make sure you let your primary caregiver know that you are having a tilt table test.  PROCEDURE   An intravenous line (IV) will be inserted into a vein in your hand or arm.   Your heart rate, heart rhythm, blood pressure, and oxygen saturation will be continuously monitored throughout the test.   You will lie down on a table. The table will have a footboard and safety straps to keep you in place.   While lying flat, the neck arteries (carotid arteries) on either side of your neck will be checked. This involves your caregiver listening with a stethoscope to the carotid arteries to check for abnormal sounds (bruits).  Your caregiver will then press on the neck arteries one at a time for several seconds. These steps will be repeated when you are in an upright position.   After you have been lying down for a period of time, you will be placed in an upright position. If you have any feeling of dizziness or feel like you are going to faint, let your caregiver know right away.   If you have dizziness, a drop in blood pressure, or a drop in heart rate, the table will be immediately placed back in a flat or head down position. If your heart rate or blood pressure does not return to normal after being placed in a flat position, medicine can be given to help with symptoms of low blood pressure or heart rate.   If you do not have symptoms when placed in an upright position, medicine may be given to provoke symptoms of dizziness while you are in an upright position for the test. Medicine may be given under the tongue or in the IV.  AFTER THE PROCEDURE   When your vital signs are stable and you do not have symptoms of dizziness or feel like you may faint, you may be allowed to go home.   Arrange for someone to drive you home.   Ask when your test results will be ready. Make sure you get your test results.  Document Released: 12/09/2003 Document Revised: 06/28/2011 Document Reviewed:   03/31/2011  Exit[employer] Patient Information 2015 [employer]. This information is not intended to replace advice given to you by your health care provider. Make sure you discuss any questions you have with your health care provider.

## 2013-10-16 NOTE — H&P (Signed)
      Patient Care Team: Provider Not In System as PCP - General   HPI  Connor Powell is a 28 y.o. male Admitted as it were for tilt table testing  He has hx of recurrent episodes of syncope that date  back about 10 years. He has 5 -10 episodes a year and they can come on relatively abruptly. Sometimes he has a prodrome and can sit down and potentially abort the spells. He has noted on one occasion that episodes last about 15 minutes. When he awakens on the ground there is some disorientation and residual orthostatic intolerance followed by profound fatigue. These episodes are accompanied in the recovery phase by nausea diaphoresis and flushing. He is described as being extremely pale. He has not noted any specific triggers.  He has been thought to have seizures although his neurologic evaluation has been negative.  He acknowledges depression and PTSD. He is on therapy.  Intercurrent course stable    Past Medical History  Diagnosis Date  . Depression   . Anxiety   . Elevated blood pressure     Has not required treatment  . Seasonal allergies   . Fatigue   . Reflux   . Migraines     Past Surgical History  Procedure Laterality Date  . Wisdom tooth extraction    . Inner ear surgery    . Incision and drainage of left foot      Current Facility-Administered Medications  Medication Dose Route Frequency Provider Last Rate Last Dose  . 0.9 %  sodium chloride infusion   Intravenous Continuous Duke SalviaSteven C Klein, MD 100 mL/hr at 10/16/13 1127      Allergies  Allergen Reactions  . Alka-Seltzer [Aspirin Effervescent] Hives and Shortness Of Breath  . Aspirin Hives and Shortness Of Breath    Review of Systems negative except from HPI and PMH  Physical Exam BP 135/85  Pulse 93  Temp(Src) 98.1 F (36.7 C) (Oral)  Resp 20  Ht 5\' 7"  (1.702 m)  Wt 250 lb (113.399 kg)  BMI 39.15 kg/m2  SpO2 98% Well developed and nourished in no acute distress HENT normal Neck supple with  JVP-flat Clear Regular rate and rhythm, no murmurs or gallops Abd-soft with active BS No Clubbing cyanosis edema Skin-warm and dry A & Oriented  Grossly normal sensory and motor function    Assessment and  Plan  For tilt table testing  reviewed

## 2013-10-25 ENCOUNTER — Other Ambulatory Visit: Payer: Self-pay

## 2013-11-14 ENCOUNTER — Ambulatory Visit (INDEPENDENT_AMBULATORY_CARE_PROVIDER_SITE_OTHER): Payer: 59 | Admitting: Internal Medicine

## 2013-11-14 ENCOUNTER — Encounter: Payer: Self-pay | Admitting: Internal Medicine

## 2013-11-14 VITALS — BP 118/76 | HR 85 | Ht 67.0 in | Wt 250.2 lb

## 2013-11-14 DIAGNOSIS — R42 Dizziness and giddiness: Secondary | ICD-10-CM

## 2013-11-14 DIAGNOSIS — R55 Syncope and collapse: Secondary | ICD-10-CM

## 2013-11-14 NOTE — Progress Notes (Signed)
      Patient Care Team: Provider Not In System as PCP - General   HPI  Connor Powell is a 28 y.o. male seeen in followup for syncope.  Comp[lex hx suggestive of neurally mediated mechanism,  Tilt table test was negative  An echocardiogram done as part of evaluation 2013 demonstrated left ventricular dilatation (55) mild left atrial enlargement and mild left ventricular dysfunction. Echo 7/15 still with minimal dilitation and low normal function   Dizziness and some orthostasis remains an issue although this is not currently all that disruptive. Psychosocial stress is been more of a problem. He has lost a couple of jobs.  He has been thought to have seizures although his neurologic evaluation has been negative.  He acknowledges depression and PTSD. He is on therapy.  Past Medical History  Diagnosis Date  . Depression   . Anxiety   . Elevated blood pressure     Has not required treatment  . Seasonal allergies   . Fatigue   . Reflux   . Migraines     Past Surgical History  Procedure Laterality Date  . Wisdom tooth extraction    . Inner ear surgery    . Incision and drainage of left foot      Current Outpatient Prescriptions  Medication Sig Dispense Refill  . BuPROPion HCl (WELLBUTRIN PO) Take 1 tablet by mouth daily. Pt does not remember dosage..    . DULoxetine (CYMBALTA) 60 MG capsule Take 60 mg by mouth at bedtime.     Marland Kitchen. LORazepam (ATIVAN) 1 MG tablet Take 1 mg by mouth 2 (two) times daily as needed for anxiety.     No current facility-administered medications for this visit.    Allergies  Allergen Reactions  . Alka-Seltzer [Aspirin Effervescent] Hives and Shortness Of Breath  . 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

## 2013-11-14 NOTE — Patient Instructions (Signed)
Your physician recommends that you continue on your current medications as directed. Please refer to the Current Medication list given to you today.  Your physician wants you to follow-up in: 1 year with Dr. Klein.  You will receive a reminder letter in the mail two months in advance. If you don't receive a letter, please call our office to schedule the follow-up appointment.  

## 2013-12-19 ENCOUNTER — Encounter (HOSPITAL_COMMUNITY): Payer: Self-pay | Admitting: Internal Medicine

## 2014-01-16 ENCOUNTER — Ambulatory Visit (INDEPENDENT_AMBULATORY_CARE_PROVIDER_SITE_OTHER): Payer: 59 | Admitting: Neurology

## 2014-01-16 ENCOUNTER — Encounter: Payer: Self-pay | Admitting: Neurology

## 2014-01-16 VITALS — BP 133/86 | HR 90 | Ht 67.0 in | Wt 247.0 lb

## 2014-01-16 DIAGNOSIS — G4730 Sleep apnea, unspecified: Secondary | ICD-10-CM
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
## 2014-01-16 NOTE — Patient Instructions (Signed)
I had a long discussion with the patient regarding his episodes of blackout with negative neurovascular and cardiac evaluation. He still continues to have chronic headaches and has risk factors for sleep apnea hence we'll check polysomnogram. He was advised to limit taking ibuprofen to avoid analgesic rebound headaches. No routine follow-up is necessary with me return only as needed.

## 2014-01-24 ENCOUNTER — Telehealth: Payer: Self-pay | Admitting: Neurology

## 2014-01-24 DIAGNOSIS — R51 Headache: Secondary | ICD-10-CM

## 2014-01-24 DIAGNOSIS — R400 Somnolence: Secondary | ICD-10-CM

## 2014-01-24 DIAGNOSIS — G478 Other sleep disorders: Secondary | ICD-10-CM

## 2014-01-24 DIAGNOSIS — R0683 Snoring: Secondary | ICD-10-CM

## 2014-01-24 DIAGNOSIS — E669 Obesity, unspecified: Secondary | ICD-10-CM

## 2014-01-24 DIAGNOSIS — R519 Headache, unspecified: Secondary | ICD-10-CM

## 2014-01-24 NOTE — Telephone Encounter (Signed)
Sleep study request review: This patient has an underlying medical history of obesity, anxiety, depression, hypertension, seasonal allergies and migraine headaches as well as presyncopal events and is referred by Dr. Pearlean BrownieSethi for an attended sleep study due to a report of snoring and excessive daytime somnolence. I will order a split-night sleep study and see the patient in sleep medicine consultation afterwards as needed. Please print this note and attach to chart.   Technologist instructions: Please score at 4% and split if 2 hour estimated AHI >20/h.    Huston FoleySaima Daniah Zaldivar, MD, PhD [facility])

## 2014-01-24 NOTE — Telephone Encounter (Signed)
Delia HeadyPramod Sethi, MD, refers patient for attended sleep study.  Height: 5'7"  Weight: 247 lb  BMI: 38.68  Past Medical History:   Depression   . Anxiety   . Elevated blood pressure     Has not required treatment  . Seasonal allergies   . Fatigue   . Reflux   . Migraines      Sleep Symptoms: He does admit to snoring, not sleeping well and tiring easily, daytime sleepiness. He has not yet had a sleep study to evaluate for sleep apnea.   Epworth Score: Unknown   Medication:  BuPROPion HCl   Take 1 tablet by mouth daily. Pt does not remember dosage..       BuPROPion HCl (Tablet SR 12 hr) WELLBUTRIN SR 150 MG Take 150 mg by mouth daily.      DULoxetine HCl (Cap DR Particles) CYMBALTA 60 MG Take 60 mg by mouth at bedtime.     Ins: [employer]   Assessment & Plan:  29 year male with long-standing history of near blackouts and blackouts of unclear etiology possibly neurally dediated syncopal events. Brain imaging, cardiac and neurovascular workup is negative. History of chronic migraines and depression as well.   PLAN:  I had a long discussion with the patient regarding his episodes of blackout with negative neurovascular and cardiac evaluation. He still continues to have chronic headaches and has risk factors for sleep apnea hence we'll check polysomnogram. He was advised to limit taking ibuprofen to avoid analgesic rebound headaches. No routine follow-up is necessary with me return only as needed.  Please review patient information and submit instructions for scheduling and orders for sleep technologist. Thank you.

## 2014-02-03 ENCOUNTER — Ambulatory Visit (INDEPENDENT_AMBULATORY_CARE_PROVIDER_SITE_OTHER): Payer: 59 | Admitting: Neurology

## 2014-02-03 VITALS — BP 141/83 | HR 93

## 2014-02-03 DIAGNOSIS — G4733 Obstructive sleep apnea (adult) (pediatric): Secondary | ICD-10-CM

## 2014-02-03 DIAGNOSIS — G479 Sleep disorder, unspecified: Secondary | ICD-10-CM

## 2014-02-03 DIAGNOSIS — F518 Other sleep disorders not due to a substance or known physiological condition: Secondary | ICD-10-CM

## 2014-02-04 NOTE — Sleep Study (Signed)
Please see the scanned sleep study interpretation located in the Media tab within the Chart Review section. °

## 2014-02-07 ENCOUNTER — Telehealth: Payer: Self-pay | Admitting: Neurology

## 2014-02-07 NOTE — Telephone Encounter (Signed)
Please call and notify the patient that the recent sleep study did not show any significant obstructive sleep apnea. Please inform patient that he can follow up with Dr. Pearlean BrownieSethi as planned. We will notify Dr. Pearlean BrownieSethi and patient's PCP and he will also get a copy in the mail of the report.  Also, route or fax report to PCP and referring MD, if other than PCP.  Once you have spoken to patient, you can close this encounter.   Thanks,  Huston FoleySaima Kambree Krauss, MD, PhD [facility])

## 2014-02-10 ENCOUNTER — Encounter: Payer: Self-pay | Admitting: Neurology

## 2014-02-10 NOTE — Telephone Encounter (Signed)
Patient was contacted and provided the results of his sleep study that only revealed very mild obstructive sleep apnea.  Patient was advised tho follow up with Dr. Pearlean BrownieSethi as well as his PCP and that no additional follow up was required with Dr. Frances FurbishAthar.  Dr. Pearlean BrownieSethi was routed a copy of the rport.

## 2016-02-15 ENCOUNTER — Ambulatory Visit (INDEPENDENT_AMBULATORY_CARE_PROVIDER_SITE_OTHER): Payer: 59 | Admitting: Otolaryngology

## 2016-02-15 DIAGNOSIS — H95122 Granulation of postmastoidectomy cavity, left ear: Secondary | ICD-10-CM

## 2016-08-15 ENCOUNTER — Ambulatory Visit (INDEPENDENT_AMBULATORY_CARE_PROVIDER_SITE_OTHER): Payer: 59 | Admitting: Otolaryngology

## 2016-08-15 DIAGNOSIS — H95122 Granulation of postmastoidectomy cavity, left ear: Secondary | ICD-10-CM

## 2022-12-27 ENCOUNTER — Observation Stay (HOSPITAL_COMMUNITY)
Admission: EM | Admit: 2022-12-27 | Discharge: 2022-12-28 | Disposition: A | Discharge status: Home / Self Care | Payer: MEDICAID | Attending: Family Medicine | Admitting: Family Medicine

## 2022-12-27 ENCOUNTER — Encounter (HOSPITAL_COMMUNITY): Payer: Self-pay | Admitting: *Deleted

## 2022-12-27 ENCOUNTER — Emergency Department (HOSPITAL_COMMUNITY): Payer: MEDICAID

## 2022-12-27 ENCOUNTER — Other Ambulatory Visit: Payer: Self-pay

## 2022-12-27 DIAGNOSIS — Z79899 Other long term (current) drug therapy: Secondary | ICD-10-CM | POA: Insufficient documentation

## 2022-12-27 DIAGNOSIS — I10 Essential (primary) hypertension: Secondary | ICD-10-CM | POA: Insufficient documentation

## 2022-12-27 DIAGNOSIS — K37 Unspecified appendicitis: Secondary | ICD-10-CM | POA: Diagnosis present

## 2022-12-27 DIAGNOSIS — K3580 Unspecified acute appendicitis: Principal | ICD-10-CM | POA: Insufficient documentation

## 2022-12-27 LAB — CBC
HCT: 44.8 % (ref 39.0–52.0)
Hemoglobin: 15.5 g/dL (ref 13.0–17.0)
MCH: 29 pg (ref 26.0–34.0)
MCHC: 34.6 g/dL (ref 30.0–36.0)
MCV: 83.7 fL (ref 80.0–100.0)
Platelets: 248 10*3/uL (ref 150–400)
RBC: 5.35 MIL/uL (ref 4.22–5.81)
RDW: 12.5 % (ref 11.5–15.5)
WBC: 19.1 10*3/uL — ABNORMAL HIGH (ref 4.0–10.5)
nRBC: 0 % (ref 0.0–0.2)

## 2022-12-27 LAB — COMPREHENSIVE METABOLIC PANEL
ALT: 28 U/L (ref 0–44)
AST: 23 U/L (ref 15–41)
Albumin: 4.4 g/dL (ref 3.5–5.0)
Alkaline Phosphatase: 41 U/L (ref 38–126)
Anion gap: 8 (ref 5–15)
BUN: 12 mg/dL (ref 6–20)
CO2: 26 mmol/L (ref 22–32)
Calcium: 9.6 mg/dL (ref 8.9–10.3)
Chloride: 101 mmol/L (ref 98–111)
Creatinine, Ser: 0.86 mg/dL (ref 0.61–1.24)
GFR, Estimated: >60 mL/min (ref >60)
Glucose, Bld: 121 mg/dL — ABNORMAL HIGH (ref 70–99)
Potassium: 3.6 mmol/L (ref 3.5–5.1)
Sodium: 135 mmol/L (ref 135–145)
Total Bilirubin: 0.6 mg/dL (ref <1.2)
Total Protein: 7.4 g/dL (ref 6.5–8.1)

## 2022-12-27 LAB — URINALYSIS, ROUTINE W REFLEX MICROSCOPIC
Bilirubin Urine: NEGATIVE
Glucose, UA: NEGATIVE mg/dL
Hgb urine dipstick: NEGATIVE
Ketones, ur: NEGATIVE mg/dL
Leukocytes,Ua: NEGATIVE
Nitrite: NEGATIVE
Protein, ur: NEGATIVE mg/dL
Specific Gravity, Urine: 1.02 (ref 1.005–1.030)
pH: 5 (ref 5.0–8.0)

## 2022-12-27 LAB — LIPASE, BLOOD: Lipase: 24 U/L (ref 11–51)

## 2022-12-27 MED ORDER — OXYCODONE HCL 5 MG PO TABS: 5.0000 mg | ORAL_TABLET | Freq: Four times a day (QID) | ORAL | Status: DC | PRN

## 2022-12-27 MED ORDER — POLYETHYLENE GLYCOL 3350 17 G PO PACK
17.0000 g | PACK | Freq: Every day | ORAL | Status: DC
  Administered 2022-12-27: 17 g via ORAL
  Filled 2022-12-27: qty 1

## 2022-12-27 MED ORDER — BISACODYL 5 MG PO TBEC: 5.0000 mg | DELAYED_RELEASE_TABLET | Freq: Every day | ORAL | Status: DC | PRN

## 2022-12-27 MED ORDER — ONDANSETRON HCL 4 MG/2ML IJ SOLN
4.0000 mg | Freq: Four times a day (QID) | INTRAMUSCULAR | Status: DC | PRN
  Administered 2022-12-28: 4 mg via INTRAVENOUS

## 2022-12-27 MED ORDER — DULOXETINE HCL 30 MG PO CPEP: 60.0000 mg | ORAL_CAPSULE | Freq: Every day | ORAL | Status: DC

## 2022-12-27 MED ORDER — BUPROPION HCL ER (SR) 150 MG PO TB12: 150.0000 mg | ORAL_TABLET | Freq: Every day | ORAL | Status: DC

## 2022-12-27 MED ORDER — ACETAMINOPHEN 325 MG PO TABS: 650.0000 mg | ORAL_TABLET | Freq: Four times a day (QID) | ORAL | Status: DC | PRN

## 2022-12-27 MED ORDER — IOHEXOL 300 MG/ML  SOLN
100.0000 mL | Freq: Once | INTRAMUSCULAR | Status: AC | PRN
  Administered 2022-12-27: 100 mL via INTRAVENOUS

## 2022-12-27 MED ORDER — ACETAMINOPHEN 650 MG RE SUPP: 650.0000 mg | Freq: Four times a day (QID) | RECTAL | Status: DC | PRN

## 2022-12-27 MED ORDER — PIPERACILLIN-TAZOBACTAM 3.375 G IVPB
3.3750 g | Freq: Three times a day (TID) | INTRAVENOUS | Status: DC
  Administered 2022-12-27 – 2022-12-28 (×3): 3.375 g via INTRAVENOUS
  Filled 2022-12-27 (×4): qty 50

## 2022-12-27 NOTE — ED Notes (Signed)
ED TO INPATIENT HANDOFF REPORT  ED Nurse Name and Phone #: [name] Name/Age/Gender Connor Powell 37 y.o. male Room/Bed: APFT23/APFT23  Code Status   Code Status: Full Code  Home/SNF/Other Home Patient oriented to: self, place, time, and situation Is this baseline? Yes   Triage Complete: Triage complete  Chief Complaint Appendicitis [K37]  Triage Note Pt c/o right lower abdominal pain that radiates to the umbilicus  Pt states she has been having some fevers and chills since 11am today  Pt c/o nausea   Allergies Allergies  Allergen Reactions   Alka-Seltzer [Aspirin Effervescent] Hives and Shortness Of Breath   Aspirin Hives and Shortness Of Breath    Level of Care/Admitting Diagnosis ED Disposition     ED Disposition  Admit   Condition  --   Comment  Hospital Area: [facility] [[id number]]  Level of Care: Med-Surg [16]  Covid Evaluation: Asymptomatic - no recent exposure (last 10 days) testing not required  Diagnosis: Appendicitis [829562]  Admitting Physician: Leeroy Bock [[id number]]  Attending Physician: Leeroy Bock [[id number]]          B Medical/Surgery History Past Medical History:  Diagnosis Date   Anxiety    Depression    Elevated blood pressure    Has not required treatment   Fatigue    Migraines    Reflux    Seasonal allergies    Past Surgical History:  Procedure Laterality Date   Incision and drainage of left foot     INNER EAR SURGERY     TILT TABLE STUDY N/A 10/16/2013   Procedure: TILT TABLE STUDY;  Surgeon: Duke Salvia, MD;  Location: [facility] CATH LAB;  Service: Cardiovascular;  Laterality: N/A;   WISDOM TOOTH EXTRACTION       A IV Location/Drains/Wounds Patient Lines/Drains/Airways Status     Active Line/Drains/Airways     Name Placement date Placement time Site Days   Peripheral IV 12/27/22 20 G Left Antecubital 12/27/22  1927  Antecubital  less than 1            Intake/Output Last 24 hours No  intake or output data in the 24 hours ending 12/27/22 2143  Labs/Imaging Results for orders placed or performed during the hospital encounter of 12/27/22 (from the past 48 hours)  Urinalysis, Routine w reflex microscopic -Urine, Clean Catch     Status: None   Collection Time: 12/27/22  2:35 PM  Result Value Ref Range   Color, Urine YELLOW YELLOW   APPearance CLEAR CLEAR   Specific Gravity, Urine 1.020 1.005 - 1.030   pH 5.0 5.0 - 8.0   Glucose, UA NEGATIVE NEGATIVE mg/dL   Hgb urine dipstick NEGATIVE NEGATIVE   Bilirubin Urine NEGATIVE NEGATIVE   Ketones, ur NEGATIVE NEGATIVE mg/dL   Protein, ur NEGATIVE NEGATIVE mg/dL   Nitrite NEGATIVE NEGATIVE   Leukocytes,Ua NEGATIVE NEGATIVE    Comment: Performed at [facility], [address], [place], [place] [address]  Lipase, blood     Status: None   Collection Time: 12/27/22  3:05 PM  Result Value Ref Range   Lipase 24 11 - 51 U/L    Comment: Performed at [facility], [address], [place], [place] [address]  Comprehensive metabolic panel     Status: Abnormal   Collection Time: 12/27/22  3:05 PM  Result Value Ref Range   Sodium 135 135 - 145 mmol/L   Potassium 3.6 3.5 - 5.1 mmol/L   Chloride 101 98 -  111 mmol/L   CO2 26 22 - 32 mmol/L   Glucose, Bld 121 (H) 70 - 99 mg/dL    Comment: Glucose reference range applies only to samples taken after fasting for at least 8 hours.   BUN 12 6 - 20 mg/dL   Creatinine, Ser 1.61 0.61 - 1.24 mg/dL   Calcium 9.6 8.9 - 09.6 mg/dL   Total Protein 7.4 6.5 - 8.1 g/dL   Albumin 4.4 3.5 - 5.0 g/dL   AST 23 15 - 41 U/L   ALT 28 0 - 44 U/L   Alkaline Phosphatase 41 38 - 126 U/L   Total Bilirubin 0.6 <1.2 mg/dL   GFR, Estimated >04 >54 mL/min    Comment: (NOTE) Calculated using the CKD-EPI Creatinine Equation (2021)    Anion gap 8 5 - 15    Comment: Performed at [facility], [address] [place], [place] [address]  CBC     Status: Abnormal   Collection Time: 12/27/22  3:05 PM  Result Value Ref  Range   WBC 19.1 (H) 4.0 - 10.5 K/uL   RBC 5.35 4.22 - 5.81 MIL/uL   Hemoglobin 15.5 13.0 - 17.0 g/dL   HCT 91.4 78.2 - 95.6 %   MCV 83.7 80.0 - 100.0 fL   MCH 29.0 26.0 - 34.0 pg   MCHC 34.6 30.0 - 36.0 g/dL   RDW 21.3 08.6 - 57.8 %   Platelets 248 150 - 400 K/uL   nRBC 0.0 0.0 - 0.2 %    Comment: Performed at [facility], [address], [place], [place] [address]   CT ABDOMEN PELVIS W CONTRAST Result Date: 12/27/2022 CLINICAL DATA:  Right lower quadrant abdominal pain radiating to the umbilicus EXAM: CT ABDOMEN AND PELVIS WITH CONTRAST TECHNIQUE: Multidetector CT imaging of the abdomen and pelvis was performed using the standard protocol following bolus administration of intravenous contrast. RADIATION DOSE REDUCTION: This exam was performed according to the departmental dose-optimization program which includes automated exposure control, adjustment of the mA and/or kV according to patient size and/or use of iterative reconstruction technique. CONTRAST:  OMNIPAQUE IOHEXOL 300 MG/ML  SOLN COMPARISON:  None Available. FINDINGS: Lower chest: No acute abnormality. Hepatobiliary: Hepatic steatosis. Normal gallbladder. No biliary dilation. Pancreas: Unremarkable. Spleen: Unremarkable. Adrenals/Urinary Tract: Normal adrenal glands. No urinary calculi or hydronephrosis. Bladder is unremarkable. Stomach/Bowel: Normal caliber large and small bowel. No bowel wall thickening. Colonic diverticulosis without diverticulitis. Stomach is within normal limits. Dilated mildly hyperemic appendix measuring 8 mm with trace adjacent free fluid and stranding (series 4/image 65). Vascular/Lymphatic: No significant vascular findings are present. No enlarged abdominal or pelvic lymph nodes. Reproductive: Unremarkable. Other: No free intraperitoneal air.  No abscess. Musculoskeletal: No acute fracture. IMPRESSION: 1. Acute uncomplicated appendicitis. 2. Hepatic steatosis. Electronically Signed   By: Minerva Fester M.D.    On: 12/27/2022 20:09    Pending Labs Unresulted Labs (From admission, onward)     Start     Ordered   12/27/22 2104  HIV Antibody (routine testing w rflx)  (HIV Antibody (Routine testing w reflex) panel)  Once,   R        12/27/22 2105            Vitals/Pain Today's Vitals   12/27/22 1345 12/27/22 1431 12/27/22 1930  BP: (!) 165/93  139/82  Pulse: 85  (!) 109  Resp: 14  16  Temp: 98.9 F (37.2 C)    TempSrc: Oral    SpO2: 99%  97%  Weight:  124.7 kg   Height:  5\' 7"  (1.702 m)   PainSc:  5      Isolation Precautions No active isolations  Medications Medications  piperacillin-tazobactam (ZOSYN) IVPB 3.375 g (3.375 g Intravenous New Bag/Given 12/27/22 2048)  DULoxetine (CYMBALTA) DR capsule 60 mg (has no administration in time range)  buPROPion (WELLBUTRIN SR) 12 hr tablet 150 mg (has no administration in time range)  acetaminophen (TYLENOL) tablet 650 mg (has no administration in time range)    Or  acetaminophen (TYLENOL) suppository 650 mg (has no administration in time range)  polyethylene glycol (MIRALAX / GLYCOLAX) packet 17 g (has no administration in time range)  bisacodyl (DULCOLAX) EC tablet 5 mg (has no administration in time range)  ondansetron (ZOFRAN) injection 4 mg (has no administration in time range)  oxyCODONE (Oxy IR/ROXICODONE) immediate release tablet 5 mg (has no administration in time range)  iohexol (OMNIPAQUE) 300 MG/ML solution 100 mL (100 mLs Intravenous Contrast Given 12/27/22 1947)    Mobility walks     Focused Assessments    R Recommendations: See Admitting Provider Note  Report given to:   Additional Notes: pt has planned surgery tomorrow - NPO p MN

## 2022-12-27 NOTE — H&P (Signed)
History and Physical    LOWERY VLASIC ZOX:[id number] DOB: 11-Oct-1985 DOA: 12/27/2022 PCP: [facility]  Chief Complaint: RLQ pain Historian: patient  HPI:  Connor Powell is a 37 y.o. male with no significant PMH.  They presented from home to the ED on 12/27/2022 with abdominal pain x a couple days. He states that since onset, pain has been gradually worsening and has been associated with nausea. Endorses subjective fever today. At the time of our encounter, he has had pain medication and endorses just a mild burning pain in RLQ.   In the ED, it was found that they had stable vitals with mild tachycardia.  Significant findings included: unremarkable urinalysis, lipase, metabolic panel. Leukocytosis present to 19.1.  CT abdomen: acute uncomplicated appendicitis  They were initially treated with zosyn.  General surgery was consulted by EDP and tentatively planning surgery tomorrow am, per report.   Patient was admitted to medicine service for further workup and management of acute appendicitis as outlined in detail below.  Assessment/Plan Principal Problem:   Appendicitis   Acute appendicitis - analgesia PRN - NPO after midnight - management per surgery   Depression - continue home wellbutrin  Past Medical History:  Diagnosis Date   Anxiety    Depression    Elevated blood pressure    Has not required treatment   Fatigue    Migraines    Reflux    Seasonal allergies     Past Surgical History:  Procedure Laterality Date   Incision and drainage of left foot     INNER EAR SURGERY     TILT TABLE STUDY N/A 10/16/2013   Procedure: TILT TABLE STUDY;  Surgeon: Duke Salvia, MD;  Location: [facility] CATH LAB;  Service: Cardiovascular;  Laterality: N/A;   WISDOM TOOTH EXTRACTION       reports that he has never smoked. He has never used smokeless tobacco. He reports that he does not drink alcohol and does not use drugs.  Allergies  Allergen Reactions    Alka-Seltzer [Aspirin Effervescent] Hives and Shortness Of Breath   Aspirin Hives and Shortness Of Breath    Family History  Problem Relation Age of Onset   Diabetes Father    Heart failure Maternal Grandmother     Prior to Admission medications   Medication Sig Start Date End Date Taking? Authorizing Provider  buPROPion (WELLBUTRIN SR) 150 MG 12 hr tablet Take 150 mg by mouth daily. 12/18/13   [provider]  BuPROPion HCl (WELLBUTRIN PO) Take 1 tablet by mouth daily. Pt does not remember dosage..    [provider]  DULoxetine (CYMBALTA) 60 MG capsule Take 60 mg by mouth at bedtime.  07/02/13   [provider]   I have personally, briefly reviewed patient's prior medical records in [facility]  Objective: Blood pressure 139/82, pulse (!) 109, temperature 98.9 F (37.2 C), temperature source Oral, resp. rate 16, height 5\' 7"  (1.702 m), weight 124.7 kg, SpO2 97%.   Constitutional: NAD, calm, comfortable Respiratory: Normal respiratory effort. No accessory muscle use.  Cardiovascular: RRR, no murmurs / rubs / gallops. No extremity edema. 2+ pedal pulses. no clubbing / cyanosis.  Abdomen: soft, NT, ND, no masses or HSM palpated. Musculoskeletal: No joint deformity upper and lower extremities. Normal muscle tone.  Skin: dry, intact, normal color, normal temperature on exposed skin Neurologic: Alert and oriented x 3. Normal speech.  Psychiatric: Normal mood. Congruent affect.  Labs on Admission: I have  personally reviewed admission labs and imaging studies  CBC    Component Value Date/Time   WBC 19.1 (H) 12/27/2022 1505   RBC 5.35 12/27/2022 1505   HGB 15.5 12/27/2022 1505   HCT 44.8 12/27/2022 1505   PLT 248 12/27/2022 1505   MCV 83.7 12/27/2022 1505   MCH 29.0 12/27/2022 1505   MCHC 34.6 12/27/2022 1505   RDW 12.5 12/27/2022 1505   LYMPHSABS 1.8 07/31/2013 1015   MONOABS 0.8 07/31/2013 1015   EOSABS 0.2 07/31/2013 1015   BASOSABS 0.0  07/31/2013 1015   CMP     Component Value Date/Time   NA 135 12/27/2022 1505   K 3.6 12/27/2022 1505   CL 101 12/27/2022 1505   CO2 26 12/27/2022 1505   GLUCOSE 121 (H) 12/27/2022 1505   BUN 12 12/27/2022 1505   CREATININE 0.86 12/27/2022 1505   CALCIUM 9.6 12/27/2022 1505   PROT 7.4 12/27/2022 1505   ALBUMIN 4.4 12/27/2022 1505   AST 23 12/27/2022 1505   ALT 28 12/27/2022 1505   ALKPHOS 41 12/27/2022 1505   BILITOT 0.6 12/27/2022 1505   GFRNONAA >60 12/27/2022 1505   GFRAA >90 05/06/2011 2157    Radiological Exams on Admission: CT ABDOMEN PELVIS W CONTRAST Result Date: 12/27/2022 CLINICAL DATA:  Right lower quadrant abdominal pain radiating to the umbilicus EXAM: CT ABDOMEN AND PELVIS WITH CONTRAST TECHNIQUE: Multidetector CT imaging of the abdomen and pelvis was performed using the standard protocol following bolus administration of intravenous contrast. RADIATION DOSE REDUCTION: This exam was performed according to the departmental dose-optimization program which includes automated exposure control, adjustment of the mA and/or kV according to patient size and/or use of iterative reconstruction technique. CONTRAST:  OMNIPAQUE IOHEXOL 300 MG/ML  SOLN COMPARISON:  None Available. FINDINGS: Lower chest: No acute abnormality. Hepatobiliary: Hepatic steatosis. Normal gallbladder. No biliary dilation. Pancreas: Unremarkable. Spleen: Unremarkable. Adrenals/Urinary Tract: Normal adrenal glands. No urinary calculi or hydronephrosis. Bladder is unremarkable. Stomach/Bowel: Normal caliber large and small bowel. No bowel wall thickening. Colonic diverticulosis without diverticulitis. Stomach is within normal limits. Dilated mildly hyperemic appendix measuring 8 mm with trace adjacent free fluid and stranding (series 4/image 65). Vascular/Lymphatic: No significant vascular findings are present. No enlarged abdominal or pelvic lymph nodes. Reproductive: Unremarkable. Other: No free  intraperitoneal air.  No abscess. Musculoskeletal: No acute fracture. IMPRESSION: 1. Acute uncomplicated appendicitis. 2. Hepatic steatosis. Electronically Signed   By: Minerva Fester M.D.   On: 12/27/2022 20:09    DVT prophylaxis: none. Ambulatory and pending surgery  Code Status: full  Family Communication: aunt at bedside  Disposition Plan: admit to med-surg  Consults called: general surgery by EDP   Leeroy Bock, DO [facility]  12/27/2022, 9:13 PM    To contact the appropriate [facility] Attending or Consulting provider: Check [email or web address] for coverage from 7pm-7am

## 2022-12-27 NOTE — Progress Notes (Signed)
Pharmacy Antibiotic Note  Connor Powell is a 37 y.o. male admitted on 12/27/2022 with  intra-abdominal infection .  Per triage note, patient reports right lower abdominal pain that radiated to the umbilicus with fevers, chills, and nausea. Pharmacy has been consulted for Zosyn dosing.  WBC 19.1 Afebrile CT a/p in process  Plan: Start Zosyn 3.375 g IV Q8H Continue to monitor renal function and follow culture results   Height: 5\' 7"  (170.2 cm) Weight: 124.7 kg (275 lb) IBW/kg (Calculated) : 66.1  Temp (24hrs), Avg:98.9 F (37.2 C), Min:98.9 F (37.2 C), Max:98.9 F (37.2 C)  Recent Labs  Lab 12/27/22 1505  WBC 19.1*  CREATININE 0.86    Estimated Creatinine Clearance: 151.8 mL/min (by C-G formula based on SCr of 0.86 mg/dL).    Allergies  Allergen Reactions   Alka-Seltzer [Aspirin Effervescent] Hives and Shortness Of Breath   Aspirin Hives and Shortness Of Breath    Antimicrobials this admission: 12/17 Zosyn >>   Dose adjustments this admission: None  Microbiology results: None  Thank you for allowing pharmacy to be a part of this patient's care.  Merryl Hacker, PharmD Clinical Pharmacist 12/27/2022 8:02 PM

## 2022-12-27 NOTE — ED Triage Notes (Signed)
Pt c/o right lower abdominal pain that radiates to the umbilicus  Pt states she has been having some fevers and chills since 11am today  Pt c/o nausea

## 2022-12-28 ENCOUNTER — Other Ambulatory Visit: Payer: Self-pay

## 2022-12-28 ENCOUNTER — Encounter (HOSPITAL_COMMUNITY)
Admission: EM | Disposition: A | Discharge status: Home / Self Care | Payer: Self-pay | Source: Home / Self Care | Attending: Student

## 2022-12-28 ENCOUNTER — Observation Stay (HOSPITAL_BASED_OUTPATIENT_CLINIC_OR_DEPARTMENT_OTHER): Payer: MEDICAID | Admitting: Anesthesiology

## 2022-12-28 ENCOUNTER — Observation Stay (HOSPITAL_COMMUNITY): Payer: MEDICAID | Admitting: Anesthesiology

## 2022-12-28 ENCOUNTER — Encounter (HOSPITAL_COMMUNITY): Payer: Self-pay | Admitting: Student in an Organized Health Care Education/Training Program

## 2022-12-28 DIAGNOSIS — K3580 Unspecified acute appendicitis: Secondary | ICD-10-CM

## 2022-12-28 DIAGNOSIS — K353 Acute appendicitis with localized peritonitis, without perforation or gangrene: Secondary | ICD-10-CM

## 2022-12-28 HISTORY — PX: XI ROBOTIC LAPAROSCOPIC ASSISTED APPENDECTOMY: SHX6877

## 2022-12-28 LAB — CBC WITH DIFFERENTIAL/PLATELET
Abs Immature Granulocytes: 0.06 10*3/uL (ref 0.00–0.07)
Basophils Absolute: 0.1 10*3/uL (ref 0.0–0.1)
Basophils Relative: 0 %
Eosinophils Absolute: 0.1 10*3/uL (ref 0.0–0.5)
Eosinophils Relative: 1 %
HCT: 42.4 % (ref 39.0–52.0)
Hemoglobin: 14.4 g/dL (ref 13.0–17.0)
Immature Granulocytes: 0 %
Lymphocytes Relative: 13 %
Lymphs Abs: 1.9 10*3/uL (ref 0.7–4.0)
MCH: 28.9 pg (ref 26.0–34.0)
MCHC: 34 g/dL (ref 30.0–36.0)
MCV: 85.1 fL (ref 80.0–100.0)
Monocytes Absolute: 1.3 10*3/uL — ABNORMAL HIGH (ref 0.1–1.0)
Monocytes Relative: 8 %
Neutro Abs: 11.7 10*3/uL — ABNORMAL HIGH (ref 1.7–7.7)
Neutrophils Relative %: 78 %
Platelets: 243 10*3/uL (ref 150–400)
RBC: 4.98 MIL/uL (ref 4.22–5.81)
RDW: 12.6 % (ref 11.5–15.5)
WBC: 15 10*3/uL — ABNORMAL HIGH (ref 4.0–10.5)
nRBC: 0 % (ref 0.0–0.2)

## 2022-12-28 LAB — MRSA NEXT GEN BY PCR, NASAL: MRSA by PCR Next Gen: NOT DETECTED

## 2022-12-28 LAB — HIV ANTIBODY (ROUTINE TESTING W REFLEX): HIV Screen 4th Generation wRfx: NONREACTIVE

## 2022-12-28 SURGERY — APPENDECTOMY, ROBOT-ASSISTED, LAPAROSCOPIC
Anesthesia: General

## 2022-12-28 MED ORDER — ONDANSETRON HCL 4 MG/2ML IJ SOLN
INTRAMUSCULAR | Status: AC
  Filled 2022-12-28: qty 2

## 2022-12-28 MED ORDER — OXYCODONE HCL 5 MG PO TABS
5.0000 mg | ORAL_TABLET | Freq: Once | ORAL | Status: AC | PRN
  Administered 2022-12-28: 5 mg via ORAL
  Filled 2022-12-28: qty 1

## 2022-12-28 MED ORDER — ONDANSETRON HCL 4 MG/2ML IJ SOLN: 4.0000 mg | Freq: Once | INTRAMUSCULAR | Status: DC | PRN

## 2022-12-28 MED ORDER — MIDAZOLAM HCL 2 MG/2ML IJ SOLN
INTRAMUSCULAR | Status: AC
  Filled 2022-12-28: qty 2

## 2022-12-28 MED ORDER — FENTANYL CITRATE (PF) 250 MCG/5ML IJ SOLN
INTRAMUSCULAR | Status: AC
  Filled 2022-12-28: qty 5

## 2022-12-28 MED ORDER — PROPOFOL 10 MG/ML IV BOLUS
INTRAVENOUS | Status: AC
  Filled 2022-12-28: qty 20

## 2022-12-28 MED ORDER — KETOROLAC TROMETHAMINE 30 MG/ML IJ SOLN
INTRAMUSCULAR | Status: AC
  Filled 2022-12-28: qty 1

## 2022-12-28 MED ORDER — LACTATED RINGERS IV SOLN: INTRAVENOUS | Status: DC

## 2022-12-28 MED ORDER — FENTANYL CITRATE PF 50 MCG/ML IJ SOSY: 25.0000 ug | PREFILLED_SYRINGE | INTRAMUSCULAR | Status: DC | PRN

## 2022-12-28 MED ORDER — BUPIVACAINE HCL (PF) 0.5 % IJ SOLN
INTRAMUSCULAR | Status: DC | PRN
  Administered 2022-12-28: 30 mL

## 2022-12-28 MED ORDER — DEXMEDETOMIDINE HCL IN NACL 80 MCG/20ML IV SOLN
INTRAVENOUS | Status: AC
  Filled 2022-12-28: qty 20

## 2022-12-28 MED ORDER — PHENYLEPHRINE 80 MCG/ML (10ML) SYRINGE FOR IV PUSH (FOR BLOOD PRESSURE SUPPORT)
PREFILLED_SYRINGE | INTRAVENOUS | Status: DC | PRN
  Administered 2022-12-28: 80 ug via INTRAVENOUS
  Administered 2022-12-28: 160 ug via INTRAVENOUS
  Administered 2022-12-28: 80 ug via INTRAVENOUS

## 2022-12-28 MED ORDER — LIDOCAINE HCL (PF) 2 % IJ SOLN
INTRAMUSCULAR | Status: DC | PRN
  Administered 2022-12-28: 100 mg via INTRADERMAL

## 2022-12-28 MED ORDER — BUPIVACAINE HCL (PF) 0.5 % IJ SOLN
INTRAMUSCULAR | Status: AC
  Filled 2022-12-28: qty 30

## 2022-12-28 MED ORDER — ORAL CARE MOUTH RINSE: 15.0000 mL | Freq: Once | OROMUCOSAL | Status: AC

## 2022-12-28 MED ORDER — FENTANYL CITRATE (PF) 250 MCG/5ML IJ SOLN
INTRAMUSCULAR | Status: DC | PRN
  Administered 2022-12-28 (×5): 50 ug via INTRAVENOUS

## 2022-12-28 MED ORDER — CHLORHEXIDINE GLUCONATE CLOTH 2 % EX PADS
6.0000 | MEDICATED_PAD | Freq: Once | CUTANEOUS | Status: DC
Start: 2022-12-28 — End: 2022-12-28

## 2022-12-28 MED ORDER — LACTATED RINGERS IV SOLN: INTRAVENOUS | Status: DC | PRN

## 2022-12-28 MED ORDER — LIDOCAINE HCL (PF) 2 % IJ SOLN
INTRAMUSCULAR | Status: AC
  Filled 2022-12-28: qty 5

## 2022-12-28 MED ORDER — DEXAMETHASONE SODIUM PHOSPHATE 10 MG/ML IJ SOLN
INTRAMUSCULAR | Status: AC
  Filled 2022-12-28: qty 1

## 2022-12-28 MED ORDER — PHENYLEPHRINE 80 MCG/ML (10ML) SYRINGE FOR IV PUSH (FOR BLOOD PRESSURE SUPPORT)
PREFILLED_SYRINGE | INTRAVENOUS | Status: AC
  Filled 2022-12-28: qty 10

## 2022-12-28 MED ORDER — MIDAZOLAM HCL 2 MG/2ML IJ SOLN
INTRAMUSCULAR | Status: DC | PRN
  Administered 2022-12-28: 2 mg via INTRAVENOUS

## 2022-12-28 MED ORDER — OXYCODONE HCL 5 MG PO TABS: 5.0000 mg | ORAL_TABLET | Freq: Four times a day (QID) | ORAL | 0 refills | Status: AC | PRN

## 2022-12-28 MED ORDER — CHLORHEXIDINE GLUCONATE CLOTH 2 % EX PADS
6.0000 | MEDICATED_PAD | Freq: Once | CUTANEOUS | Status: AC
  Administered 2022-12-28: 6 via TOPICAL

## 2022-12-28 MED ORDER — DEXMEDETOMIDINE HCL IN NACL 80 MCG/20ML IV SOLN
INTRAVENOUS | Status: DC | PRN
  Administered 2022-12-28 (×2): 8 ug via INTRAVENOUS
  Administered 2022-12-28: 12 ug via INTRAVENOUS

## 2022-12-28 MED ORDER — PROPOFOL 10 MG/ML IV BOLUS
INTRAVENOUS | Status: DC | PRN
  Administered 2022-12-28: 100 mg via INTRAVENOUS
  Administered 2022-12-28: 200 mg via INTRAVENOUS

## 2022-12-28 MED ORDER — CHLORHEXIDINE GLUCONATE 0.12 % MT SOLN
15.0000 mL | Freq: Once | OROMUCOSAL | Status: AC
  Administered 2022-12-28: 15 mL via OROMUCOSAL

## 2022-12-28 MED ORDER — DEXAMETHASONE SODIUM PHOSPHATE 10 MG/ML IJ SOLN
INTRAMUSCULAR | Status: DC | PRN
  Administered 2022-12-28: 5 mg via INTRAVENOUS

## 2022-12-28 MED ORDER — ACETAMINOPHEN 500 MG PO TABS: 1000.0000 mg | ORAL_TABLET | Freq: Four times a day (QID) | ORAL | 0 refills | Status: AC

## 2022-12-28 MED ORDER — ROCURONIUM BROMIDE 10 MG/ML (PF) SYRINGE
PREFILLED_SYRINGE | INTRAVENOUS | Status: AC
  Filled 2022-12-28: qty 10

## 2022-12-28 MED ORDER — CHLORHEXIDINE GLUCONATE CLOTH 2 % EX PADS
6.0000 | MEDICATED_PAD | Freq: Every day | CUTANEOUS | Status: DC
  Administered 2022-12-28: 6 via TOPICAL

## 2022-12-28 MED ORDER — SUGAMMADEX SODIUM 200 MG/2ML IV SOLN
INTRAVENOUS | Status: DC | PRN
  Administered 2022-12-28: 300 mg via INTRAVENOUS

## 2022-12-28 MED ORDER — DOCUSATE SODIUM 100 MG PO CAPS: 100.0000 mg | ORAL_CAPSULE | Freq: Two times a day (BID) | ORAL | 2 refills | Status: AC

## 2022-12-28 MED ORDER — ROCURONIUM BROMIDE 10 MG/ML (PF) SYRINGE
PREFILLED_SYRINGE | INTRAVENOUS | Status: DC | PRN
  Administered 2022-12-28: 60 mg via INTRAVENOUS
  Administered 2022-12-28: 20 mg via INTRAVENOUS

## 2022-12-28 MED ORDER — ORAL CARE MOUTH RINSE: 15.0000 mL | OROMUCOSAL | Status: DC | PRN

## 2022-12-28 MED ORDER — OXYCODONE HCL 5 MG/5ML PO SOLN: 5.0000 mg | Freq: Once | ORAL | Status: AC | PRN

## 2022-12-28 MED ORDER — STERILE WATER FOR IRRIGATION IR SOLN
Status: DC | PRN
  Administered 2022-12-28: 1000 mL

## 2022-12-28 MED ORDER — ACETAMINOPHEN 500 MG PO TABS
1000.0000 mg | ORAL_TABLET | Freq: Four times a day (QID) | ORAL | Status: DC
  Administered 2022-12-28: 1000 mg via ORAL
  Filled 2022-12-28: qty 2

## 2022-12-28 SURGICAL SUPPLY — 49 items
CHLORAPREP W/TINT 26 (MISCELLANEOUS) ×2 IMPLANT
COVER LIGHT HANDLE STERIS (MISCELLANEOUS) ×4 IMPLANT
COVER MAYO STAND XLG (MISCELLANEOUS) ×2 IMPLANT
COVER TIP SHEARS 8 DVNC (MISCELLANEOUS) IMPLANT
DEFOGGER SCOPE WARMER CLEARIFY (MISCELLANEOUS) ×2 IMPLANT
DERMABOND ADVANCED .7 DNX12 (GAUZE/BANDAGES/DRESSINGS) ×2 IMPLANT
DERMABOND ADVANCED .7 DNX6 (GAUZE/BANDAGES/DRESSINGS) IMPLANT
DRAPE ARM DVNC X/XI (DISPOSABLE) ×6 IMPLANT
DRAPE COLUMN DVNC XI (DISPOSABLE) ×2 IMPLANT
FORCEPS BPLR R/ABLATION 8 DVNC (INSTRUMENTS) ×2 IMPLANT
GLOVE BIOGEL M 7.0 STRL (GLOVE) IMPLANT
GLOVE BIOGEL PI IND STRL 6.5 (GLOVE) ×4 IMPLANT
GLOVE BIOGEL PI IND STRL 7.0 (GLOVE) ×6 IMPLANT
GLOVE SURG SS PI 6.5 STRL IVOR (GLOVE) ×4 IMPLANT
GOWN STRL REUS W/TWL LRG LVL3 (GOWN DISPOSABLE) ×6 IMPLANT
GRASPER SUT TROCAR 14GX15 (MISCELLANEOUS) ×2 IMPLANT
GRASPER TIP-UP FEN DVNC XI (INSTRUMENTS) IMPLANT
HEMOSTAT SNOW SURGICEL 2X4 (HEMOSTASIS) IMPLANT
IRRIGATOR SUCT 8 DISP DVNC XI (IRRIGATION / IRRIGATOR) IMPLANT
KIT PINK PAD W/HEAD ARE REST (MISCELLANEOUS) ×1 IMPLANT
KIT PINK PAD W/HEAD ARM REST (MISCELLANEOUS) ×2 IMPLANT
KIT TURNOVER KIT A (KITS) ×2 IMPLANT
MANIFOLD NEPTUNE II (INSTRUMENTS) ×2 IMPLANT
NDL HYPO 21X1.5 SAFETY (NEEDLE) ×2 IMPLANT
NDL INSUFFLATION 14GA 120MM (NEEDLE) ×2 IMPLANT
NEEDLE HYPO 21X1.5 SAFETY (NEEDLE) ×1 IMPLANT
NEEDLE INSUFFLATION 14GA 120MM (NEEDLE) ×1 IMPLANT
OBTURATOR OPTICAL STND 8 DVNC (TROCAR) ×1 IMPLANT
OBTURATOR OPTICALSTD 8 DVNC (TROCAR) ×2 IMPLANT
PACK LAP CHOLE LZT030E (CUSTOM PROCEDURE TRAY) ×2 IMPLANT
PENCIL HANDSWITCHING (ELECTRODE) ×2 IMPLANT
RELOAD STAPLE 45 2.5 WHT DVNC (STAPLE) IMPLANT
RELOAD STAPLER 2.5X45 WHT DVNC (STAPLE) ×1 IMPLANT
SCISSORS MNPLR CVD DVNC XI (INSTRUMENTS) IMPLANT
SEAL UNIV 5-12 XI (MISCELLANEOUS) ×8 IMPLANT
SEALER VESSEL EXT DVNC XI (MISCELLANEOUS) ×2 IMPLANT
SET BASIN LINEN APH (SET/KITS/TRAYS/PACK) ×2 IMPLANT
SET TUBE SMOKE EVAC HIGH FLOW (TUBING) ×2 IMPLANT
STAPLER 45 SUREFORM DVNC (STAPLE) ×2 IMPLANT
STAPLER RELOAD 2.5X45 WHT DVNC (STAPLE) ×1 IMPLANT
SUT MNCRL AB 4-0 PS2 18 (SUTURE) ×2 IMPLANT
SUT SILK 2-0 18XBRD TIE 12 (SUTURE) ×2 IMPLANT
SUT VICRYL 0 AB UR-6 (SUTURE) ×2 IMPLANT
SYR 30ML LL (SYRINGE) ×2 IMPLANT
SYS RETRIEVAL 5MM INZII UNIV (BASKET) ×1 IMPLANT
SYSTEM RETRIEVL 5MM INZII UNIV (BASKET) ×2 IMPLANT
TAPE TRANSPORE STRL 2 31045 (GAUZE/BANDAGES/DRESSINGS) ×2 IMPLANT
TRAY FOLEY W/BAG SLVR 16FR ST (SET/KITS/TRAYS/PACK) ×2 IMPLANT
WATER STERILE IRR 500ML POUR (IV SOLUTION) ×2 IMPLANT

## 2022-12-28 NOTE — Op Note (Signed)
[facility] Operative Note  Preoperative diagnosis: Acute appendicitis  Postoperative diagnosis: Acute suppurative appendicitis  Procedure: Robotic assisted laparoscopic appendectomy.  Anesthesia: General   Surgeon: Theophilus Kinds, DO  Wound Classification: Dirty/Infected  Specimen: Appendix  Complications: None  Estimated Blood Loss: minimal  Indications: Patient is a 37 y.o. male  presented with a 12 hour history of right lower quadrant abdominal pain.  He underwent a CT of the abdomen and pelvis that demonstrated acute uncomplicated appendictis. The risk of surgery were explained to the patient including but not limited to bleeding, infection, finding a rupture, injury to other organs, needing to do an open procedure.   FIndings: Acute suppurative appendicitis Upon entering the abdomen (organ space), I encountered a phlegmon involving the appendix .  Description of procedure: The patient was placed on the operating table in the supine position, left arm tucked. General anesthesia was induced. A time-out was completed verifying correct patient, procedure, site, positioning, and implant(s) and/or special equipment prior to beginning this procedure. The abdomen was prepped and draped in the usual sterile fashion.   At Palmer's point, an incision was made and Veress needle was inserted.  After confirming intraabdominal location with positive saline drop test and low insufflation pressures, gas insufflation was initiated until the abdominal pressure was measured at 15 mmHg.  Afterwards, the Veress needle was removed and a 8 mm port was placed through the Palmer's point site using Optiview technique. No injuries were noted.  Two additional incisions were made 8 cm apart along the left side of the abdominal wall from the initial incision.  8 mm ports were then placed under direct visualization.  The initial 8 mm port was then upsized to a 12 mm port.  No injuries from  trocar placements were noted. The table flexed and was placed in the Trendelenburg position with the right side elevated.  Xi robotic platform was then brought to the operative field and docked. A vessel sealer was placed through the 12 mm port and a forced bipolar through the lower 8 mm port.   Upon entering the abdomen (organ space), I encountered a phlegmon involving the appendix . An appendix, that appeared acutely inflamed and suppurative, was identified and elevated.  Infection was present within the abdominal cavity due to appendicitis. The vessel sealer was used to ligate the mesoappendix.  A white load linear cutting stapler was placed through the 12 mm port, and then used to divide and staple the base of the appendix. No bleeding from the staple lines noted.  The appendix was placed in an endoscopic retrieval bag and removed through the 12 mm port.   The appendiceal stump and mesoappendix staple line examined again and hemostasis noted. Surgical SNOW was placed around the staple line an cecum in the right paracolic gutter.  No other pathology was identified within pelvis. The 12 mm trocar removed and port site closed with PMI using 0 vicryl under direct vision. Remaining trocars were removed. No bleeding was noted.  The abdomen was allowed to collapse. Marcaine was instilled at the incision sites.  All skin incisions then closed with subcuticular sutures Monocryl 4-0.  Wounds then dressed with dermabond.  The patient tolerated the procedure well, awakened from anesthesia and was taken to the postanesthesia care unit in satisfactory condition.  Sponge count and instrument count correct at the end of the procedure.  Theophilus Kinds, DO [facility] [address] [place], [place] [address] [phone] (office)

## 2022-12-28 NOTE — Progress Notes (Signed)
Pt reports he does not have insurance. Agreeable to referral to financial counselor to screen for Medicaid. LCSW referred to [facility], [place].    12/28/22 0746  TOC Brief Assessment  Insurance and Status Lapsed  Patient has primary care physician Yes  Home environment has been reviewed Lives with parents.  Prior level of function: Independent.  Prior/Current Home Services No current home services  Social Drivers of Health Review SDOH reviewed no interventions necessary  Readmission risk has been reviewed Yes  Transition of care needs no transition of care needs at this time

## 2022-12-28 NOTE — Consult Note (Signed)
[facility] Consult  Reason for Consult: Acute appendicitis Referring Physician: Dr. Earle Gell  Chief Complaint   Abdominal Pain     HPI: SHERIDAN RITCHISON is a 37 y.o. male who presented to the hospital with a 12-hour history of right lower quadrant abdominal pain.  He started having abdominal pain in the morning, and then he started feeling more sick with nausea which prompted him to come to the emergency department.  He denies any episodes of emesis.  He denies ever having pain like this in the past.  He has a past medical history significant for anxiety/depression, hypertension not on medication, and GERD.  His surgical history is significant for a left foot surgery and a left inner ear surgery.  In the ED, he was noted to be hemodynamically stable.  He had a leukocytosis of 19.1.  He underwent a CT of the abdomen and pelvis which demonstrated acute uncomplicated appendicitis.  This morning, he still has some right lower quadrant abdominal pain, and had a little bit of nausea without emesis.  Past Medical History:  Diagnosis Date   Anxiety    Depression    Elevated blood pressure    Has not required treatment   Fatigue    Migraines    Reflux    Seasonal allergies     Past Surgical History:  Procedure Laterality Date   Incision and drainage of left foot     INNER EAR SURGERY     TILT TABLE STUDY N/A 10/16/2013   Procedure: TILT TABLE STUDY;  Surgeon: Duke Salvia, MD;  Location: [facility] CATH LAB;  Service: Cardiovascular;  Laterality: N/A;   WISDOM TOOTH EXTRACTION      Family History  Problem Relation Age of Onset   Diabetes Father    Heart failure Maternal Grandmother     Social History   Tobacco Use   Smoking status: Never   Smokeless tobacco: Never  Vaping Use   Vaping status: Never Used  Substance Use Topics   Alcohol use: No    Alcohol/week: 0.0 standard drinks of alcohol   Drug use: No    Medications: I have reviewed the patient's current  medications.  Allergies  Allergen Reactions   Alka-Seltzer [Aspirin Effervescent] Hives and Shortness Of Breath   Aspirin Hives and Shortness Of Breath     ROS:  Pertinent items are noted in HPI.  Blood pressure 139/88, pulse (!) 101, temperature 98.3 F (36.8 C), resp. rate 18, height 5\' 7"  (1.702 m), weight 125.8 kg, SpO2 97%. Physical Exam Vitals reviewed.  Constitutional:      Appearance: He is well-developed.  HENT:     Head: Normocephalic and atraumatic.  Eyes:     Extraocular Movements: Extraocular movements intact.     Pupils: Pupils are equal, round, and reactive to light.  Cardiovascular:     Rate and Rhythm: Tachycardia present.  Pulmonary:     Effort: Pulmonary effort is normal.  Abdominal:     Comments: Abdomen soft, nondistended, no percussion tenderness, tenderness to palpation right lower quadrant; no rigidity, guarding, rebound tenderness  Skin:    General: Skin is warm and dry.  Neurological:     General: No focal deficit present.     Mental Status: He is alert and oriented to person, place, and time.  Psychiatric:        Mood and Affect: Mood normal.        Behavior: Behavior normal.     Results: Results for orders  placed or performed during the hospital encounter of 12/27/22 (from the past 48 hours)  Urinalysis, Routine w reflex microscopic -Urine, Clean Catch     Status: None   Collection Time: 12/27/22  2:35 PM  Result Value Ref Range   Color, Urine YELLOW YELLOW   APPearance CLEAR CLEAR   Specific Gravity, Urine 1.020 1.005 - 1.030   pH 5.0 5.0 - 8.0   Glucose, UA NEGATIVE NEGATIVE mg/dL   Hgb urine dipstick NEGATIVE NEGATIVE   Bilirubin Urine NEGATIVE NEGATIVE   Ketones, ur NEGATIVE NEGATIVE mg/dL   Protein, ur NEGATIVE NEGATIVE mg/dL   Nitrite NEGATIVE NEGATIVE   Leukocytes,Ua NEGATIVE NEGATIVE    Comment: Performed at [facility], [address] [place], [place] [address]  Lipase, blood     Status: None   Collection Time: 12/27/22   3:05 PM  Result Value Ref Range   Lipase 24 11 - 51 U/L    Comment: Performed at [facility], [address] [place], [place] [address]  Comprehensive metabolic panel     Status: Abnormal   Collection Time: 12/27/22  3:05 PM  Result Value Ref Range   Sodium 135 135 - 145 mmol/L   Potassium 3.6 3.5 - 5.1 mmol/L   Chloride 101 98 - 111 mmol/L   CO2 26 22 - 32 mmol/L   Glucose, Bld 121 (H) 70 - 99 mg/dL    Comment: Glucose reference range applies only to samples taken after fasting for at least 8 hours.   BUN 12 6 - 20 mg/dL   Creatinine, Ser 0.98 0.61 - 1.24 mg/dL   Calcium 9.6 8.9 - 11.9 mg/dL   Total Protein 7.4 6.5 - 8.1 g/dL   Albumin 4.4 3.5 - 5.0 g/dL   AST 23 15 - 41 U/L   ALT 28 0 - 44 U/L   Alkaline Phosphatase 41 38 - 126 U/L   Total Bilirubin 0.6 <1.2 mg/dL   GFR, Estimated >14 >78 mL/min    Comment: (NOTE) Calculated using the CKD-EPI Creatinine Equation (2021)    Anion gap 8 5 - 15    Comment: Performed at [facility], [address], [place], [place] [address]  CBC     Status: Abnormal   Collection Time: 12/27/22  3:05 PM  Result Value Ref Range   WBC 19.1 (H) 4.0 - 10.5 K/uL   RBC 5.35 4.22 - 5.81 MIL/uL   Hemoglobin 15.5 13.0 - 17.0 g/dL   HCT 13.0 86.5 - 78.4 %   MCV 83.7 80.0 - 100.0 fL   MCH 29.0 26.0 - 34.0 pg   MCHC 34.6 30.0 - 36.0 g/dL   RDW 69.6 29.5 - 28.4 %   Platelets 248 150 - 400 K/uL   nRBC 0.0 0.0 - 0.2 %    Comment: Performed at [facility], [address] [place], [place] [address]  MRSA Next Gen by PCR, Nasal     Status: None   Collection Time: 12/27/22 11:36 PM   Specimen: Nasal Mucosa; Nasal Swab  Result Value Ref Range   MRSA by PCR Next Gen NOT DETECTED NOT DETECTED    Comment: (NOTE) The GeneXpert MRSA Assay (FDA approved for NASAL specimens only), is one component of a comprehensive MRSA colonization surveillance program. It is not intended to diagnose MRSA infection nor to guide or monitor treatment for MRSA infections. Test  performance is not FDA approved in patients less than 4 years old. Performed at [facility], [address] [place],  [place] [address]   CBC with Differential/Platelet     Status: Abnormal   Collection Time: 12/28/22  4:33 AM  Result Value Ref Range   WBC 15.0 (H) 4.0 - 10.5 K/uL   RBC 4.98 4.22 - 5.81 MIL/uL   Hemoglobin 14.4 13.0 - 17.0 g/dL   HCT 46.9 62.9 - 52.8 %   MCV 85.1 80.0 - 100.0 fL   MCH 28.9 26.0 - 34.0 pg   MCHC 34.0 30.0 - 36.0 g/dL   RDW 41.3 24.4 - 01.0 %   Platelets 243 150 - 400 K/uL   nRBC 0.0 0.0 - 0.2 %   Neutrophils Relative % 78 %   Neutro Abs 11.7 (H) 1.7 - 7.7 K/uL   Lymphocytes Relative 13 %   Lymphs Abs 1.9 0.7 - 4.0 K/uL   Monocytes Relative 8 %   Monocytes Absolute 1.3 (H) 0.1 - 1.0 K/uL   Eosinophils Relative 1 %   Eosinophils Absolute 0.1 0.0 - 0.5 K/uL   Basophils Relative 0 %   Basophils Absolute 0.1 0.0 - 0.1 K/uL   Immature Granulocytes 0 %   Abs Immature Granulocytes 0.06 0.00 - 0.07 K/uL    Comment: Performed at [facility], [address], [place], [place] [address]    CT ABDOMEN PELVIS W CONTRAST Result Date: 12/27/2022 CLINICAL DATA:  Right lower quadrant abdominal pain radiating to the umbilicus EXAM: CT ABDOMEN AND PELVIS WITH CONTRAST TECHNIQUE: Multidetector CT imaging of the abdomen and pelvis was performed using the standard protocol following bolus administration of intravenous contrast. RADIATION DOSE REDUCTION: This exam was performed according to the departmental dose-optimization program which includes automated exposure control, adjustment of the mA and/or kV according to patient size and/or use of iterative reconstruction technique. CONTRAST:  OMNIPAQUE IOHEXOL 300 MG/ML  SOLN COMPARISON:  None Available. FINDINGS: Lower chest: No acute abnormality. Hepatobiliary: Hepatic steatosis. Normal gallbladder. No biliary dilation. Pancreas: Unremarkable. Spleen: Unremarkable. Adrenals/Urinary Tract: Normal adrenal glands. No  urinary calculi or hydronephrosis. Bladder is unremarkable. Stomach/Bowel: Normal caliber large and small bowel. No bowel wall thickening. Colonic diverticulosis without diverticulitis. Stomach is within normal limits. Dilated mildly hyperemic appendix measuring 8 mm with trace adjacent free fluid and stranding (series 4/image 65). Vascular/Lymphatic: No significant vascular findings are present. No enlarged abdominal or pelvic lymph nodes. Reproductive: Unremarkable. Other: No free intraperitoneal air.  No abscess. Musculoskeletal: No acute fracture. IMPRESSION: 1. Acute uncomplicated appendicitis. 2. Hepatic steatosis. Electronically Signed   By: Minerva Fester M.D.   On: 12/27/2022 20:09     Assessment & Plan:  DOIL BALFANZ is a 37 y.o. male who was admitted to the hospital with acute appendicitis.  Imaging and blood work evaluated by myself.    -We discussed the pathophysiology of appendicitis and our options -Discussed the risk of laparoscopic appendectomy and the option of antibiotics alone. Discussed that in [place] and some trials in the [place], antibiotics are used for simple appendicitis. Discussed that research shows a 40% failure rate for antibiotics alone.  Discussed risk of surgery including but not limited to bleeding, infection, injury to other organs, normal appendix, and after this discussion the patient has decided to proceed with surgery. -Plan for robotic assisted laparoscopic appendectomy today -NPO -Continue IV Zosyn -PRN pain control and antiemetics -Further recommendations to follow surgery  All questions were answered to the satisfaction of the patient and family.  -- Theophilus Kinds, DO [facility] [address] [place], [place] [address] [phone] (office)

## 2022-12-28 NOTE — Progress Notes (Signed)
[facility]  Spoke with the patient's parents in the consultation room.  I explained that he tolerated the procedure without difficulty.  He has dissolvable stitches under the skin with overlying skin glue.  This will flake off in 10 to 14 days.  He will be discharged home with a prescription for narcotic pain medication that they should take as needed for pain.  I also want him taking scheduled Tylenol.  If they take the narcotic pain medication, they should take a stool softener as well.  The patient will follow-up with me in 2 weeks for phone follow-up.  He will return to his room on the floor, and if he is able to tolerate a diet without nausea and vomiting, and pain is controlled with oral medications, he will be stable for discharge home today.  All questions were answered to their expressed satisfaction.  Plan: -Return to room on floor -Ok for afternoon dose of Zosyn.  No need for further antibiotics at discharge -PRN pain control and antiemetics -If he tolerates diet without nausea and vomiting and pain is controlled with oral medications, he will be stable for discharge home today  Theophilus Kinds, DO [facility] [address] [place], [place] [address] [phone] (office)

## 2022-12-28 NOTE — Discharge Instructions (Signed)
Ambulatory Surgery Discharge Instructions  General Anesthesia or Sedation Do not drive or operate heavy machinery for 24 hours.  Do not consume alcohol, tranquilizers, sleeping medications, or any non-prescribed medications for 24 hours. Do not make important decisions or sign any important papers in the next 24 hours. You should have someone with you tonight at home.  Activity  You are advised to go directly home from the hospital.  Restrict your activities and rest for a day.  Resume light activity tomorrow. No heavy lifting over 10 lbs or strenuous exercise.  Fluids and Diet Regular diet  Medications  If you have not had a bowel movement in 24 hours, take 2 tablespoons over the counter Milk of mag.             You May resume your blood thinners tomorrow (Aspirin, coumadin, or other).  You are being discharged with prescriptions for Opioid/Narcotic Medications: There are some specific considerations for these medications that you should know. Opioid Meds have risks & benefits. Addiction to these meds is always a concern with prolonged use Take medication only as directed Do not drive while taking narcotic pain medication Do not crush tablets or capsules Do not use a different container than medication was dispensed in Lock the container of medication in a cool, dry place out of reach of children and pets. Opioid medication can cause addiction Do not share with anyone else (this is a felony) Do not store medications for future use. Dispose of them properly.     Disposal:  Find a [facility] household drug take back site near you.  If you can't get to a drug take back site, use the recipe below as a last resort to dispose of expired, unused or unwanted drugs. Disposal  (Do not dispose chemotherapy drugs this way, talk to your prescribing doctor instead.) Step 1: Mix drugs (do not crush) with dirt, kitty litter, or used coffee grounds and add a small amount of water to dissolve any  solid medications. Step 2: Seal drugs in plastic bag. Step 3: Place plastic bag in trash. Step 4: Take prescription container and scratch out personal information, then recycle or throw away.  Operative Site  You have a liquid bandage over your incisions, this will begin to flake off in about a week. Ok to shower tomorrow. Keep wound clean and dry. No baths or swimming. No lifting more than 10 pounds.  Contact Information: If you have questions or concerns, please call our office, [phone], Monday- Thursday 8AM-5PM and Friday 8AM-12Noon.  If it is after hours or on the weekend, please call [facility] Main [facility], [phone], and ask to speak to the surgeon on call for Dr. Brittnee Gaetano at [place].   SPECIFIC COMPLICATIONS TO WATCH FOR: Inability to urinate Fever over 101? F by mouth Nausea and vomiting lasting longer than 24 hours. Pain not relieved by medication ordered Swelling around the operative site Increased redness, warmth, hardness, around operative area Numbness, tingling, or cold fingers or toes Blood -soaked dressing, (small amounts of oozing may be normal) Increasing and progressive drainage from surgical area or exam site  

## 2022-12-28 NOTE — ED Provider Notes (Signed)
[facility] Provider Note  CSN: [id number] Arrival date & time: 12/27/22 1337  Chief Complaint(s) Abdominal Pain  HPI Connor Powell is a 37 y.o. male with PMH anxiety, depression, migraines who presents emergency department for evaluation of abdominal pain.  Patient states that this morning he had sudden onset right lower quadrant abdominal pain and periumbilical abdominal pain with mild nausea but no vomiting.  States that pain has started to improve but is now a dull ache.  Denies associated chest pain, shortness of breath, headache, fever or other systemic symptoms.   Past Medical History Past Medical History:  Diagnosis Date   Anxiety    Depression    Elevated blood pressure    Has not required treatment   Fatigue    Migraines    Reflux    Seasonal allergies    Patient Active Problem List   Diagnosis Date Noted   Appendicitis 12/27/2022   Spells 06/07/2013   Syncope 06/16/2011   Elevated blood pressure 06/16/2011   Home Medication(s) Prior to Admission medications   Medication Sig Start Date End Date Taking? Authorizing Provider  acetaminophen (TYLENOL) 500 MG tablet Take 2 tablets (1,000 mg total) by mouth every 6 (six) hours for 7 days. 12/28/22 01/04/23 Yes Pappayliou, Santina Evans A, DO  calcium carbonate (TUMS - DOSED IN MG ELEMENTAL CALCIUM) 500 MG chewable tablet Chew 1 tablet by mouth daily as needed for indigestion or heartburn.   Yes [provider]  docusate sodium (COLACE) 100 MG capsule Take 1 capsule (100 mg total) by mouth 2 (two) times daily. 12/28/22 12/28/23 Yes Pappayliou, Santina Evans A, DO  ibuprofen (ADVIL) 200 MG tablet Take 800 mg by mouth every 6 (six) hours as needed for mild pain (pain score 1-3).   Yes [provider]  oxyCODONE (ROXICODONE) 5 MG immediate release tablet Take 1 tablet (5 mg total) by mouth every 6 (six) hours as needed. 12/28/22  Yes Pappayliou, Gustavus Messing, DO                                                                                                                                     Past Surgical History Past Surgical History:  Procedure Laterality Date   Incision and drainage of left foot     INNER EAR SURGERY     TILT TABLE STUDY N/A 10/16/2013   Procedure: TILT TABLE STUDY;  Surgeon: Duke Salvia, MD;  Location: [facility] CATH LAB;  Service: Cardiovascular;  Laterality: N/A;   WISDOM TOOTH EXTRACTION     Family History Family History  Problem Relation Age of Onset   Diabetes Father    Heart failure Maternal Grandmother     Social History Social History   Tobacco Use   Smoking status: Never   Smokeless tobacco: Never  Vaping Use   Vaping status: Never Used  Substance Use Topics   Alcohol use: No    Alcohol/week:  0.0 standard drinks of alcohol   Drug use: No   Allergies Alka-seltzer [aspirin effervescent] and Aspirin  Review of Systems Review of Systems  Gastrointestinal:  Positive for abdominal pain and nausea.    Physical Exam Vital Signs  I have reviewed the triage vital signs BP (!) 151/79 (BP Location: Right Arm)   Pulse (!) 113   Temp 98.4 F (36.9 C)   Resp 17   Ht 5\' 7"  (1.702 m)   Wt 125.8 kg   SpO2 100%   BMI 43.44 kg/m   Physical Exam Constitutional:      General: He is not in acute distress.    Appearance: Normal appearance.  HENT:     Head: Normocephalic and atraumatic.     Nose: No congestion or rhinorrhea.  Eyes:     General:        Right eye: No discharge.        Left eye: No discharge.     Extraocular Movements: Extraocular movements intact.     Pupils: Pupils are equal, round, and reactive to light.  Cardiovascular:     Rate and Rhythm: Normal rate and regular rhythm.     Heart sounds: No murmur heard. Pulmonary:     Effort: No respiratory distress.     Breath sounds: No wheezing or rales.  Abdominal:     General: There is no distension.     Tenderness: There is abdominal tenderness in the right lower quadrant.   Musculoskeletal:        General: Normal range of motion.     Cervical back: Normal range of motion.  Skin:    General: Skin is warm and dry.  Neurological:     General: No focal deficit present.     Mental Status: He is alert.     ED Results and Treatments Labs (all labs ordered are listed, but only abnormal results are displayed) Labs Reviewed  COMPREHENSIVE METABOLIC PANEL - Abnormal; Notable for the following components:      Result Value   Glucose, Bld 121 (*)    All other components within normal limits  CBC - Abnormal; Notable for the following components:   WBC 19.1 (*)    All other components within normal limits  CBC WITH DIFFERENTIAL/PLATELET - Abnormal; Notable for the following components:   WBC 15.0 (*)    Neutro Abs 11.7 (*)    Monocytes Absolute 1.3 (*)    All other components within normal limits  MRSA NEXT GEN BY PCR, NASAL  LIPASE, BLOOD  URINALYSIS, ROUTINE W REFLEX MICROSCOPIC  HIV ANTIBODY (ROUTINE TESTING W REFLEX)  SURGICAL PATHOLOGY                                                                                                                          Radiology CT ABDOMEN PELVIS W CONTRAST Result Date: 12/27/2022 CLINICAL DATA:  Right lower quadrant abdominal pain radiating to the umbilicus EXAM: CT ABDOMEN AND PELVIS WITH  CONTRAST TECHNIQUE: Multidetector CT imaging of the abdomen and pelvis was performed using the standard protocol following bolus administration of intravenous contrast. RADIATION DOSE REDUCTION: This exam was performed according to the departmental dose-optimization program which includes automated exposure control, adjustment of the mA and/or kV according to patient size and/or use of iterative reconstruction technique. CONTRAST:  OMNIPAQUE IOHEXOL 300 MG/ML  SOLN COMPARISON:  None Available. FINDINGS: Lower chest: No acute abnormality. Hepatobiliary: Hepatic steatosis. Normal gallbladder. No biliary dilation. Pancreas:  Unremarkable. Spleen: Unremarkable. Adrenals/Urinary Tract: Normal adrenal glands. No urinary calculi or hydronephrosis. Bladder is unremarkable. Stomach/Bowel: Normal caliber large and small bowel. No bowel wall thickening. Colonic diverticulosis without diverticulitis. Stomach is within normal limits. Dilated mildly hyperemic appendix measuring 8 mm with trace adjacent free fluid and stranding (series 4/image 65). Vascular/Lymphatic: No significant vascular findings are present. No enlarged abdominal or pelvic lymph nodes. Reproductive: Unremarkable. Other: No free intraperitoneal air.  No abscess. Musculoskeletal: No acute fracture. IMPRESSION: 1. Acute uncomplicated appendicitis. 2. Hepatic steatosis. Electronically Signed   By: Minerva Fester M.D.   On: 12/27/2022 20:09    Pertinent labs & imaging results that were available during my care of the patient were reviewed by me and considered in my medical decision making (see MDM for details).  Medications Ordered in ED Medications  piperacillin-tazobactam (ZOSYN) IVPB 3.375 g ( Intravenous Automatically Held 01/12/23 2200)  acetaminophen (TYLENOL) tablet 650 mg ( Oral MAR Hold 12/28/22 0927)    Or  acetaminophen (TYLENOL) suppository 650 mg ( Rectal MAR Hold 12/28/22 0927)  polyethylene glycol (MIRALAX / GLYCOLAX) packet 17 g ( Oral Automatically Held 01/12/23 1000)  bisacodyl (DULCOLAX) EC tablet 5 mg ( Oral MAR Hold 12/28/22 0927)  ondansetron (ZOFRAN) injection 4 mg (4 mg Intravenous Given 12/28/22 1132)  oxyCODONE (Oxy IR/ROXICODONE) immediate release tablet 5 mg ( Oral MAR Hold 12/28/22 0927)  Chlorhexidine Gluconate Cloth 2 % PADS 6 each ( Topical Automatically Held 01/05/23 1000)  lactated ringers infusion ( Intravenous See Procedure Record 12/28/22 0959)  oxyCODONE (Oxy IR/ROXICODONE) immediate release tablet 5 mg (has no administration in time range)    Or  oxyCODONE (ROXICODONE) 5 MG/5ML solution 5 mg (has no administration in time  range)  fentaNYL (SUBLIMAZE) injection 25-50 mcg (has no administration in time range)  ondansetron (ZOFRAN) injection 4 mg (has no administration in time range)  Chlorhexidine Gluconate Cloth 2 % PADS 6 each (has no administration in time range)    And  Chlorhexidine Gluconate Cloth 2 % PADS 6 each (6 each Topical Given 12/28/22 0949)  sterile water for irrigation for irrigation (1,000 mLs Irrigation Given 12/28/22 1014)  bupivacaine(PF) (MARCAINE) 0.5 % injection (30 mLs Infiltration Given 12/28/22 1114)  iohexol (OMNIPAQUE) 300 MG/ML solution 100 mL (100 mLs Intravenous Contrast Given 12/27/22 1947)  chlorhexidine (PERIDEX) 0.12 % solution 15 mL (15 mLs Mouth/Throat Given 12/28/22 0949)    Or  Oral care mouth rinse ( Mouth Rinse See Alternative 12/28/22 0949)  Procedures Procedures  (including critical care time)  Medical Decision Making / ED Course   This patient presents to the ED for concern of abdominal pain, this involves an extensive number of treatment options, and is a complaint that carries with it a high risk of complications and morbidity.  The differential diagnosis includes appendicitis, nephrolithiasis, diverticulitis, epiploic appendagitis, inflammatory bowel disease, constipation, gastroenteritis,  MDM: Patient seen emergency room for evaluation of right lower quadrant abdominal pain.  Physical exam with mild periumbilical and right lower quadrant tenderness to palpation but no rebound or guarding.  Laboratory evaluation with a leukocytosis to 19.1 but is otherwise unremarkable.  CT abdomen pelvis concerning for acute uncomplicated appendicitis.  Patient started on Zosyn and I spoke with the general surgeon on-call Dr. Robyne Peers who will evaluate the patient in the morning for surgery.  Patient admitted to the hospitalist..    Additional  history obtained:  -External records from outside source obtained and reviewed including: Chart review including previous notes, labs, imaging, consultation notes   Lab Tests: -I ordered, reviewed, and interpreted labs.   The pertinent results include:   Labs Reviewed  COMPREHENSIVE METABOLIC PANEL - Abnormal; Notable for the following components:      Result Value   Glucose, Bld 121 (*)    All other components within normal limits  CBC - Abnormal; Notable for the following components:   WBC 19.1 (*)    All other components within normal limits  CBC WITH DIFFERENTIAL/PLATELET - Abnormal; Notable for the following components:   WBC 15.0 (*)    Neutro Abs 11.7 (*)    Monocytes Absolute 1.3 (*)    All other components within normal limits  MRSA NEXT GEN BY PCR, NASAL  LIPASE, BLOOD  URINALYSIS, ROUTINE W REFLEX MICROSCOPIC  HIV ANTIBODY (ROUTINE TESTING W REFLEX)  SURGICAL PATHOLOGY         Imaging Studies ordered: I ordered imaging studies including CTAP I independently visualized and interpreted imaging. I agree with the radiologist interpretation   Medicines ordered and prescription drug management: Meds ordered this encounter  Medications   iohexol (OMNIPAQUE) 300 MG/ML solution 100 mL   piperacillin-tazobactam (ZOSYN) IVPB 3.375 g    Antibiotic Indication::   Intra-abdominal Infection   DISCONTD: DULoxetine (CYMBALTA) DR capsule 60 mg   DISCONTD: buPROPion (WELLBUTRIN SR) 12 hr tablet 150 mg   OR Linked Order Group    acetaminophen (TYLENOL) tablet 650 mg    acetaminophen (TYLENOL) suppository 650 mg   polyethylene glycol (MIRALAX / GLYCOLAX) packet 17 g   bisacodyl (DULCOLAX) EC tablet 5 mg   ondansetron (ZOFRAN) injection 4 mg   oxyCODONE (Oxy IR/ROXICODONE) immediate release tablet 5 mg    Refill:  0   Chlorhexidine Gluconate Cloth 2 % PADS 6 each   lactated ringers infusion   OR Linked Order Group    chlorhexidine (PERIDEX) 0.12 % solution 15 mL    Oral  care mouth rinse   OR Linked Order Group    oxyCODONE (Oxy IR/ROXICODONE) immediate release tablet 5 mg    oxyCODONE (ROXICODONE) 5 MG/5ML solution 5 mg   fentaNYL (SUBLIMAZE) injection 25-50 mcg   ondansetron (ZOFRAN) injection 4 mg   AND Linked Order Group    Chlorhexidine Gluconate Cloth 2 % PADS 6 each    Chlorhexidine Gluconate Cloth 2 % PADS 6 each   sterile water for irrigation for irrigation   bupivacaine(PF) (MARCAINE) 0.5 % injection   acetaminophen (TYLENOL) 500 MG tablet  Sig: Take 2 tablets (1,000 mg total) by mouth every 6 (six) hours for 7 days.    Dispense:  56 tablet    Refill:  0   docusate sodium (COLACE) 100 MG capsule    Sig: Take 1 capsule (100 mg total) by mouth 2 (two) times daily.    Dispense:  60 capsule    Refill:  2   oxyCODONE (ROXICODONE) 5 MG immediate release tablet    Sig: Take 1 tablet (5 mg total) by mouth every 6 (six) hours as needed.    Dispense:  8 tablet    Refill:  0    -I have reviewed the patients home medicines and have made adjustments as needed  Critical interventions none  Consultations Obtained: I requested consultation with the general surgeon on-call,  and discussed lab and imaging findings as well as pertinent plan - they recommend: Medical admission for surgical evaluation in the morning   Cardiac Monitoring: The patient was maintained on a cardiac monitor.  I personally viewed and interpreted the cardiac monitored which showed an underlying rhythm of: NSR  Social Determinants of Health:  Factors impacting patients care include: none   Reevaluation: After the interventions noted above, I reevaluated the patient and found that they have :improved  Co morbidities that complicate the patient evaluation  Past Medical History:  Diagnosis Date   Anxiety    Depression    Elevated blood pressure    Has not required treatment   Fatigue    Migraines    Reflux    Seasonal allergies       Dispostion: I considered  admission for this patient, and patient require hospital admission for acute uncomplicated appendicitis     Final Clinical Impression(s) / ED Diagnoses Final diagnoses:  Acute appendicitis, unspecified acute appendicitis type     @PCDICTATION @    Glendora Score, MD 12/28/22 1222

## 2022-12-28 NOTE — Anesthesia Procedure Notes (Signed)
Procedure Name: Intubation Date/Time: 12/28/2022 10:32 AM  Performed by: Julian Reil, CRNAPre-anesthesia Checklist: Patient identified, Emergency Drugs available, Suction available and Patient being monitored Patient Re-evaluated:Patient Re-evaluated prior to induction Oxygen Delivery Method: Circle system utilized Preoxygenation: Pre-oxygenation with 100% oxygen Induction Type: IV induction Ventilation: Mask ventilation without difficulty Laryngoscope Size: Mac and 4 Tube type: Oral Tube size: 7.5 mm Number of attempts: 1 Airway Equipment and Method: Stylet Placement Confirmation: ETT inserted through vocal cords under direct vision, positive ETCO2 and breath sounds checked- equal and bilateral Secured at: 24 cm Tube secured with: Tape Dental Injury: Teeth and Oropharynx as per pre-operative assessment

## 2022-12-28 NOTE — Discharge Summary (Signed)
Physician Discharge Summary  SAVEON BARSAMIAN ZOX:[id number] DOB: 1985/05/20 DOA: 12/27/2022  PCP: [facility]  Admit date: 12/27/2022 Discharge date: 12/28/2022  Admitted From:  HOME  Disposition:  HOME   Recommendations for Outpatient Follow-up:  Follow up with Dr. Robyne Peers as scheduled   Discharge Condition: STABLE   CODE STATUS: FULL DIET: Regular    Brief Hospitalization Summary: Please see all hospital notes, images, labs for full details of the hospitalization. Admission provider HPI:  37 y.o. male with no significant PMH.   They presented from home to the ED on 12/27/2022 with abdominal pain x a couple days. He states that since onset, pain has been gradually worsening and has been associated with nausea. Endorses subjective fever today. At the time of our encounter, he has had pain medication and endorses just a mild burning pain in RLQ.    In the ED, it was found that they had stable vitals with mild tachycardia.  Significant findings included: unremarkable urinalysis, lipase, metabolic panel. Leukocytosis present to 19.1.  CT abdomen: acute uncomplicated appendicitis   They were initially treated with zosyn.  General surgery was consulted by EDP and tentatively planning surgery tomorrow am, per report.    Patient was admitted to medicine service for further workup and management of acute appendicitis as outlined in detail below.  Hospital Course  Pt was admitted for acute appendicitis.  He was seen by surgeon Dr. Robyne Peers and taken to OR for robotic lap appendectomy on 12/28/22 with findings of acute suppurative appendicitis.  He was then monitored on the med surg unit and diet restarted.  Surgery noted he could discharge later today if tolerating diet with no significant n/v.  He has a follow up arranged with Dr. Robyne Peers and he has discharge prescriptions for pain.  He is discharging home in stable condition.    Discharge Diagnoses:  Principal  Problem:   Appendicitis   Discharge Instructions: Discharge Instructions     Call MD for:  persistant nausea and vomiting   Complete by: As directed    Call MD for:  redness, tenderness, or signs of infection (pain, swelling, redness, odor or green/yellow discharge around incision site)   Complete by: As directed    Call MD for:  severe uncontrolled pain   Complete by: As directed    Call MD for:  temperature >100.4   Complete by: As directed    Diet - low sodium heart healthy   Complete by: As directed    Increase activity slowly   Complete by: As directed       Allergies as of 12/28/2022       Reactions   Alka-seltzer [aspirin Effervescent] Hives, Shortness Of Breath   Aspirin Hives, Shortness Of Breath        Medication List     TAKE these medications    acetaminophen 500 MG tablet Commonly known as: TYLENOL Take 2 tablets (1,000 mg total) by mouth every 6 (six) hours for 7 days.   calcium carbonate 500 MG chewable tablet Commonly known as: TUMS - dosed in mg elemental calcium Chew 1 tablet by mouth daily as needed for indigestion or heartburn.   docusate sodium 100 MG capsule Commonly known as: Colace Take 1 capsule (100 mg total) by mouth 2 (two) times daily.   ibuprofen 200 MG tablet Commonly known as: ADVIL Take 800 mg by mouth every 6 (six) hours as needed for mild pain (pain score 1-3).   oxyCODONE 5 MG  immediate release tablet Commonly known as: Roxicodone Take 1 tablet (5 mg total) by mouth every 6 (six) hours as needed.        Follow-up Information     Pappayliou, Gustavus Messing, DO. Call.   Specialty: General Surgery Why: I will call you for a phone follow up in 2 weeks Contact information: [address] [place] [facility] [address] [phone]                Allergies  Allergen Reactions   Alka-Seltzer [Aspirin Effervescent] Hives and Shortness Of Breath   Aspirin Hives and Shortness Of Breath   Allergies as of 12/28/2022        Reactions   Alka-seltzer [aspirin Effervescent] Hives, Shortness Of Breath   Aspirin Hives, Shortness Of Breath        Medication List     TAKE these medications    acetaminophen 500 MG tablet Commonly known as: TYLENOL Take 2 tablets (1,000 mg total) by mouth every 6 (six) hours for 7 days.   calcium carbonate 500 MG chewable tablet Commonly known as: TUMS - dosed in mg elemental calcium Chew 1 tablet by mouth daily as needed for indigestion or heartburn.   docusate sodium 100 MG capsule Commonly known as: Colace Take 1 capsule (100 mg total) by mouth 2 (two) times daily.   ibuprofen 200 MG tablet Commonly known as: ADVIL Take 800 mg by mouth every 6 (six) hours as needed for mild pain (pain score 1-3).   oxyCODONE 5 MG immediate release tablet Commonly known as: Roxicodone Take 1 tablet (5 mg total) by mouth every 6 (six) hours as needed.        Procedures/Studies: CT ABDOMEN PELVIS W CONTRAST Result Date: 12/27/2022 CLINICAL DATA:  Right lower quadrant abdominal pain radiating to the umbilicus EXAM: CT ABDOMEN AND PELVIS WITH CONTRAST TECHNIQUE: Multidetector CT imaging of the abdomen and pelvis was performed using the standard protocol following bolus administration of intravenous contrast. RADIATION DOSE REDUCTION: This exam was performed according to the departmental dose-optimization program which includes automated exposure control, adjustment of the mA and/or kV according to patient size and/or use of iterative reconstruction technique. CONTRAST:  OMNIPAQUE IOHEXOL 300 MG/ML  SOLN COMPARISON:  None Available. FINDINGS: Lower chest: No acute abnormality. Hepatobiliary: Hepatic steatosis. Normal gallbladder. No biliary dilation. Pancreas: Unremarkable. Spleen: Unremarkable. Adrenals/Urinary Tract: Normal adrenal glands. No urinary calculi or hydronephrosis. Bladder is unremarkable. Stomach/Bowel: Normal caliber large and small bowel. No bowel wall  thickening. Colonic diverticulosis without diverticulitis. Stomach is within normal limits. Dilated mildly hyperemic appendix measuring 8 mm with trace adjacent free fluid and stranding (series 4/image 65). Vascular/Lymphatic: No significant vascular findings are present. No enlarged abdominal or pelvic lymph nodes. Reproductive: Unremarkable. Other: No free intraperitoneal air.  No abscess. Musculoskeletal: No acute fracture. IMPRESSION: 1. Acute uncomplicated appendicitis. 2. Hepatic steatosis. Electronically Signed   By: Minerva Fester M.D.   On: 12/27/2022 20:09     Subjective: Pt tolerating diet and no significant n/v.  He verbalized understanding to the discharge and follow up instructions.   Discharge Exam: Vitals:   12/28/22 1230 12/28/22 1300  BP: 121/68 129/80  Pulse: 82 84  Resp: 13 16  Temp:  98.9 F (37.2 C)  SpO2: 95% 95%   Vitals:   12/28/22 1215 12/28/22 1226 12/28/22 1230 12/28/22 1300  BP: 127/86  121/68 129/80  Pulse: 79 89 82 84  Resp: (!) 32 20 13 16   Temp:  98.1 F (36.7 C)  98.9 F (37.2 C)  TempSrc:    Oral  SpO2: 97% 95% 95% 95%  Weight:      Height:       General: Pt is alert, awake, not in acute distress Cardiovascular: normal S1/S2 +, no rubs, no gallops Respiratory: CTA bilaterally, no wheezing, no rhonchi Abdominal: Soft, NT, ND, bowel sounds + Extremities: no edema, no cyanosis   The results of significant diagnostics from this hospitalization (including imaging, microbiology, ancillary and laboratory) are listed below for reference.     Microbiology: Recent Results (from the past 240 hours)  MRSA Next Gen by PCR, Nasal     Status: None   Collection Time: 12/27/22 11:36 PM   Specimen: Nasal Mucosa; Nasal Swab  Result Value Ref Range Status   MRSA by PCR Next Gen NOT DETECTED NOT DETECTED Final    Comment: (NOTE) The GeneXpert MRSA Assay (FDA approved for NASAL specimens only), is one component of a comprehensive MRSA colonization  surveillance program. It is not intended to diagnose MRSA infection nor to guide or monitor treatment for MRSA infections. Test performance is not FDA approved in patients less than 19 years old. Performed at [facility], [address], [place], [place] [address]      Labs: BNP (last 3 results) No results for input(s): "BNP" in the last 8760 hours. Basic Metabolic Panel: Recent Labs  Lab 12/27/22 1505  NA 135  K 3.6  CL 101  CO2 26  GLUCOSE 121*  BUN 12  CREATININE 0.86  CALCIUM 9.6   Liver Function Tests: Recent Labs  Lab 12/27/22 1505  AST 23  ALT 28  ALKPHOS 41  BILITOT 0.6  PROT 7.4  ALBUMIN 4.4   Recent Labs  Lab 12/27/22 1505  LIPASE 24   No results for input(s): "AMMONIA" in the last 168 hours. CBC: Recent Labs  Lab 12/27/22 1505 12/28/22 0433  WBC 19.1* 15.0*  NEUTROABS  --  11.7*  HGB 15.5 14.4  HCT 44.8 42.4  MCV 83.7 85.1  PLT 248 243   Cardiac Enzymes: No results for input(s): "CKTOTAL", "CKMB", "CKMBINDEX", "TROPONINI" in the last 168 hours. BNP: Invalid input(s): "POCBNP" CBG: No results for input(s): "GLUCAP" in the last 168 hours. D-Dimer No results for input(s): "DDIMER" in the last 72 hours. Hgb A1c No results for input(s): "HGBA1C" in the last 72 hours. Lipid Profile No results for input(s): "CHOL", "HDL", "LDLCALC", "TRIG", "CHOLHDL", "LDLDIRECT" in the last 72 hours. Thyroid function studies No results for input(s): "TSH", "T4TOTAL", "T3FREE", "THYROIDAB" in the last 72 hours.  Invalid input(s): "FREET3" Anemia work up No results for input(s): "VITAMINB12", "FOLATE", "FERRITIN", "TIBC", "IRON", "RETICCTPCT" in the last 72 hours. Urinalysis    Component Value Date/Time   COLORURINE YELLOW 12/27/2022 1435   APPEARANCEUR CLEAR 12/27/2022 1435   LABSPEC 1.020 12/27/2022 1435   PHURINE 5.0 12/27/2022 1435   GLUCOSEU NEGATIVE 12/27/2022 1435   HGBUR NEGATIVE 12/27/2022 1435   BILIRUBINUR NEGATIVE 12/27/2022 1435    KETONESUR NEGATIVE 12/27/2022 1435   PROTEINUR NEGATIVE 12/27/2022 1435   NITRITE NEGATIVE 12/27/2022 1435   LEUKOCYTESUR NEGATIVE 12/27/2022 1435   Sepsis Labs Recent Labs  Lab 12/27/22 1505 12/28/22 0433  WBC 19.1* 15.0*   Microbiology Recent Results (from the past 240 hours)  MRSA Next Gen by PCR, Nasal     Status: None   Collection Time: 12/27/22 11:36 PM   Specimen: Nasal Mucosa; Nasal Swab  Result Value Ref Range Status   MRSA by PCR Next  Gen NOT DETECTED NOT DETECTED Final    Comment: (NOTE) The GeneXpert MRSA Assay (FDA approved for NASAL specimens only), is one component of a comprehensive MRSA colonization surveillance program. It is not intended to diagnose MRSA infection nor to guide or monitor treatment for MRSA infections. Test performance is not FDA approved in patients less than 55 years old. Performed at [facility], [address] [place], [place] [address]    Time coordinating discharge:  29 mins  SIGNED:  Standley Dakins, MD  [facility] 12/28/2022, 3:22 PM How to contact the [facility] Attending or Consulting provider 7A - 7P or covering provider during after hours 7P -7A, for this patient?  Check the care team in [facility] and look for a) attending/consulting [facility] provider listed and b) the [facility] team listed Log into [email or web address] and use [place]'s universal password to access. If you do not have the password, please contact the hospital operator. Locate the [facility] provider you are looking for under [facility] and page to a number that you can be directly reached. If you still have difficulty reaching the provider, please page the [facility] (Director on Call) for the Hospitalists listed on amion for assistance.

## 2022-12-28 NOTE — Plan of Care (Signed)
  Problem: Education: Goal: Knowledge of General Education information will improve Description: Including pain rating scale, medication(s)/side effects and non-pharmacologic comfort measures Outcome: Completed/Met   Problem: Activity: Goal: Risk for activity intolerance will decrease Outcome: Completed/Met   Problem: Coping: Goal: Level of anxiety will decrease Outcome: Completed/Met

## 2022-12-28 NOTE — Transfer of Care (Signed)
Immediate Anesthesia Transfer of Care Note  Patient: Connor Powell  Procedure(s) Performed: XI ROBOTIC LAPAROSCOPIC ASSISTED APPENDECTOMY  Patient Location: PACU  Anesthesia Type:General  Level of Consciousness: awake  Airway & Oxygen Therapy: Patient Spontanous Breathing and Patient connected to face mask oxygen  Post-op Assessment: Report given to RN and Post -op Vital signs reviewed and stable  Post vital signs: Reviewed and stable  Last Vitals:  Vitals Value Taken Time  BP 151/79   Temp 98.4   Pulse 91   Resp 16   SpO2 100%     Last Pain:  Vitals:   12/28/22 0943  TempSrc: Oral  PainSc: 5       Patients Stated Pain Goal: 7 (12/28/22 0943)  Complications: No notable events documented.

## 2022-12-28 NOTE — Anesthesia Preprocedure Evaluation (Signed)
Anesthesia Evaluation  Patient identified by MRN, date of birth, ID band Patient awake    Reviewed: Allergy & Precautions, H&P , NPO status , Patient's Chart, lab work & pertinent test results, reviewed documented beta blocker date and time   Airway Mallampati: II  TM Distance: >3 FB Neck ROM: full    Dental no notable dental hx.    Pulmonary neg pulmonary ROS   Pulmonary exam normal breath sounds clear to auscultation       Cardiovascular Exercise Tolerance: Good hypertension, negative cardio ROS  Rhythm:regular Rate:Normal     Neuro/Psych  Headaches PSYCHIATRIC DISORDERS Anxiety Depression    negative neurological ROS  negative psych ROS   GI/Hepatic negative GI ROS, Neg liver ROS,,,  Endo/Other    Class 3 obesity  Renal/GU negative Renal ROS  negative genitourinary   Musculoskeletal   Abdominal   Peds  Hematology negative hematology ROS (+)   Anesthesia Other Findings   Reproductive/Obstetrics negative OB ROS                             Anesthesia Physical Anesthesia Plan  ASA: 3 and emergent  Anesthesia Plan: General and General ETT   Post-op Pain Management:    Induction:   PONV Risk Score and Plan: Ondansetron  Airway Management Planned:   Additional Equipment:   Intra-op Plan:   Post-operative Plan:   Informed Consent: I have reviewed the patients History and Physical, chart, labs and discussed the procedure including the risks, benefits and alternatives for the proposed anesthesia with the patient or authorized representative who has indicated his/her understanding and acceptance.     Dental Advisory Given  Plan Discussed with: CRNA  Anesthesia Plan Comments:        Anesthesia Quick Evaluation

## 2022-12-28 NOTE — Progress Notes (Addendum)
On admission to room alert and oriented with no nausea and abd pain rated a 2 when still. Last bm this morning which was normal. Given sprite and iceee before midnight and knows to have nothing after.  Stated that he doesn't take any home medications so did not want cymbalta which was scheduled.  MRSA swab taken to lab.

## 2022-12-29 LAB — SURGICAL PATHOLOGY

## 2022-12-30 ENCOUNTER — Encounter (HOSPITAL_COMMUNITY): Payer: Self-pay | Admitting: Surgery

## 2022-12-30 NOTE — Anesthesia Postprocedure Evaluation (Signed)
Anesthesia Post Note  Patient: Connor Powell  Procedure(s) Performed: XI ROBOTIC LAPAROSCOPIC ASSISTED APPENDECTOMY  Patient location during evaluation: Phase II Anesthesia Type: General Level of consciousness: awake Pain management: pain level controlled Vital Signs Assessment: post-procedure vital signs reviewed and stable Respiratory status: spontaneous breathing and respiratory function stable Cardiovascular status: blood pressure returned to baseline and stable Postop Assessment: no headache and no apparent nausea or vomiting Anesthetic complications: no Comments: Late entry   No notable events documented.   Last Vitals:  Vitals:   12/28/22 1230 12/28/22 1300  BP: 121/68 129/80  Pulse: 82 84  Resp: 13 16  Temp:  37.2 C  SpO2: 95% 95%    Last Pain:  Vitals:   12/28/22 1315  TempSrc:   PainSc: 5                  Windell Norfolk

## 2023-01-12 ENCOUNTER — Ambulatory Visit (INDEPENDENT_AMBULATORY_CARE_PROVIDER_SITE_OTHER): Payer: Self-pay | Admitting: Surgery

## 2023-01-12 DIAGNOSIS — Z09 Encounter for follow-up examination after completed treatment for conditions other than malignant neoplasm: Secondary | ICD-10-CM

## 2023-01-12 NOTE — Progress Notes (Signed)
 [facility]  I am calling the patient for post operative evaluation. This is not a billable encounter as it is under the global charges for the surgery.  The patient had a robotic assisted laparoscopic appendectomy on 12/18. The patient reports that he is doing well. He is tolerating a diet, having good pain control, and having regular Bms.  The incisions are healing well and the glue has come off. The patient has no concerns.   Pathology: A. APPENDIX, APPENDECTOMY:       Acute appendicitis with paraappendicitis and serositis.       Fibrous obliteration of the tip.       One benign reactive lymph node.   Will see the patient PRN.   Dorothyann Brittle, DO [facility] [address] [place], [place] [address] [phone] (office)
